# Patient Record
Sex: Female | Born: 1935 | Hispanic: No | Smoking: Never smoker
Health system: Southern US, Community
[De-identification: ages and names within clinical notes are randomized; demographics above are authoritative.]

## PROBLEM LIST (undated history)

## (undated) DIAGNOSIS — I10 Essential (primary) hypertension: Secondary | ICD-10-CM

## (undated) DIAGNOSIS — E119 Type 2 diabetes mellitus without complications: Secondary | ICD-10-CM

## (undated) HISTORY — PX: TOE AMPUTATION: SHX809

## (undated) HISTORY — PX: ABDOMINAL HYSTERECTOMY: SHX81

## (undated) HISTORY — PX: CARDIAC SURGERY: SHX584

---

## 2016-04-17 ENCOUNTER — Encounter: Payer: Self-pay | Admitting: Physical Therapy

## 2016-04-17 ENCOUNTER — Ambulatory Visit: Payer: Medicare Other | Attending: Rheumatology | Admitting: Physical Therapy

## 2016-04-17 DIAGNOSIS — M25611 Stiffness of right shoulder, not elsewhere classified: Secondary | ICD-10-CM | POA: Diagnosis present

## 2016-04-17 DIAGNOSIS — M6281 Muscle weakness (generalized): Secondary | ICD-10-CM

## 2016-04-17 DIAGNOSIS — R296 Repeated falls: Secondary | ICD-10-CM | POA: Insufficient documentation

## 2016-04-17 DIAGNOSIS — M25511 Pain in right shoulder: Secondary | ICD-10-CM

## 2016-04-18 NOTE — Therapy (Deleted)
[facility] [address] [place], [place], [address] Phone: [phone]   Fax:  [phone]  Physical Therapy Evaluation  Patient Details  Name: Hannah Obrien MRN: [id number] Date of Birth: 09/02/1936 Referring Provider: Andrez Grime  Encounter Date: 05-02-16      PT End of Session - 04/18/16 2015/01/23    Visit Number 1   Number of Visits 8   Date for PT Re-Evaluation 05/15/16   Authorization - Visit Number 1   Authorization - Number of Visits 10   PT Start Time 1022   PT Stop Time 1130   PT Time Calculation (min) 68 min   Activity Tolerance Patient tolerated treatment well;Patient limited by pain   Behavior During Therapy [facility] for tasks assessed/performed      History reviewed. No pertinent past medical history.  History reviewed. No pertinent surgical history.  There were no vitals filed for this visit.           [facility] PT Assessment - 04/18/16 0001      Assessment   Medical Diagnosis Chronic Right Shoulder Pain   Referring Provider G.W. Gavin Potters   Onset Date/Surgical Date 09/16/00                                       Plan - 04/18/16 2016/01/23    Rehab Potential Fair   PT Frequency 2x / week   PT Duration 4 weeks   PT Treatment/Interventions Patient/family education;Cryotherapy;Electrical Stimulation;Moist Heat;Neuromuscular re-education;Therapeutic exercise;Therapeutic activities;Functional mobility training;Manual techniques;Passive range of motion   PT Next Visit Plan Review HEP   PT Home Exercise Plan See HEP/ ROM ex. (pulley).        Patient will benefit from skilled therapeutic intervention in order to improve the following deficits and impairments:  Hypomobility, Decreased activity tolerance, Decreased strength, Pain, Decreased mobility, Decreased range of motion, Postural dysfunction, Impaired flexibility, Improper body mechanics  Visit Diagnosis: Pain in right  shoulder  Shoulder joint stiffness, right  Muscle weakness (generalized)      G-Codes - 05/02/2016 01-22-2018    Functional Assessment Tool Used Clinical impression/ QuickDASH/ muscle weakness/ joint stiffness/ pain   Functional Limitation Carrying, moving and handling objects   Carrying, Moving and Handling Objects Current Status [phone]) At least 40 percent but less than 60 percent impaired, limited or restricted   Carrying, Moving and Handling Objects Goal Status (J1791) At least 20 percent but less than 40 percent impaired, limited or restricted       Problem List There are no active problems to display for this patient.   Cammie Mcgee 04/18/2016, 8:21 PM  [place] [facility] [address] [place], [place], [address] Phone: [phone]   Fax:  [phone]  Name: Hannah Obrien MRN: [id number] Date of Birth: 11-Jul-1936

## 2016-04-22 ENCOUNTER — Ambulatory Visit: Payer: Medicare Other | Admitting: Physical Therapy

## 2016-04-22 ENCOUNTER — Encounter: Payer: Self-pay | Admitting: Physical Therapy

## 2016-04-22 DIAGNOSIS — M25611 Stiffness of right shoulder, not elsewhere classified: Secondary | ICD-10-CM

## 2016-04-22 DIAGNOSIS — M6281 Muscle weakness (generalized): Secondary | ICD-10-CM

## 2016-04-22 DIAGNOSIS — M25511 Pain in right shoulder: Secondary | ICD-10-CM | POA: Diagnosis not present

## 2016-04-22 NOTE — Therapy (Signed)
[facility] [address] [place], [place], [address] Phone: [phone]   Fax:  [phone]  Physical Therapy Treatment  Patient Details  Name: Hannah Obrien MRN: [id number] Date of Birth: 02/12/1936 Referring Provider: Andrez Grime  Encounter Date: 04/22/2016      PT End of Session - 04/22/16 1539    Visit Number 2   Number of Visits 8   Date for PT Re-Evaluation 05/15/16   Authorization - Visit Number 2   Authorization - Number of Visits 10   PT Start Time 1449   PT Stop Time 1538   PT Time Calculation (min) 49 min   Activity Tolerance Patient tolerated treatment well;Patient limited by pain   Behavior During Therapy [facility] for tasks assessed/performed      History reviewed. No pertinent past medical history.  History reviewed. No pertinent surgical history.  There were no vitals filed for this visit.      Subjective Assessment - 04/22/16 1534    Subjective Pt. states she is doing better today but R shoulder is still hurting.  No subjective pain score reported today.  No questions with HEP.     Limitations Standing;House hold activities;Other (comment)   Patient Stated Goals Increase R shoulder AROM/ strength to improve pain-free mobility.     Currently in Pain? Yes       OBJECTIVE:  Manual tx.: supine R shoulder AA/PROM all planes of movement 10x each.  STM to R post./ant. Shoulder/ prox, biceps as tolerated.  Gentle grade II-III mobs. To R shoulder (AP/PA/inf. Mobs.) 3x20 sec.  There.ex.: Reviewed HEP.  Supine serratus punches AROM 10x.  Seated R shoulder flexion with wand/ bicep curls while MH is on R shoulder/UT region.  Standing wall ladder 10x (fatigue noted).      Pt response for medical necessity: benefits from progressive R shoulder HEP to increase ROM/ strengthening to improve functional mobility.  (+) R shoulder tenderness noted and moderate crepitus noted during overhead reaching/ flexion.         PT  Education - 04/22/16 1242    Education provided Yes   Education Details Seated shoulder pulley (flexion/ abd.) and pendulum ex.  Encouraged pain tolerable AROM with daily tasks.     Person(s) Educated Patient   Methods Explanation;Demonstration;Handout   Comprehension Verbalized understanding;Returned demonstration             PT Long Term Goals - 04/22/16 1302      PT LONG TERM GOAL #1   Title Pt. I with HEP to increase R shoulder flexion/abd. >130 deg. to improve overhead reaching/ pain-free mobility.     Baseline Decrease B shoulder AROM (R/L):  flexion (124/136 deg.), abd. (85/135 deg.), ER (18/32 deg.), IR (51/85 deg.).    Time 4   Period Weeks   Status New     PT LONG TERM GOAL #2   Title Pt. will decrease QuickDASH to <50% to improve pain-free mobility/ daily tasks.    Baseline QuickDASH: 79.5% on 8/2   Time 4   Period Weeks   Status New     PT LONG TERM GOAL #3   Title Pt. will be able to complete 30 min. of household chores with no increase c/o R shoulder pain.     Baseline pain limited with all daily/ household tasks.  Pt. reports 10/10 R shoulder at worst.    Time 4   Period Weeks   Status New     PT LONG  TERM GOAL #4   Title Pt. able to tolerate sleeping/lying on R shoulder with no increase c/o pain to improve sleeping.     Baseline Unable to lie on R side/shoulder   Time 4   Period Weeks   Status New            Plan - 04/22/16 1542    Clinical Impression Statement Pt. presents with increase R shoulder AROM after manual tx./ stretches.  Pt. remains pain limited with R anterior shoulder tenderness/ UT pain during manual tx.  Significant R shoulder limitations during ER due to pain/ muscle guarding.  Pt. enjoys MH to R UT region during seated R sh. AA/PROM.      Rehab Potential Fair   PT Frequency 2x / week   PT Duration 4 weeks   PT Treatment/Interventions Patient/family education;Cryotherapy;Electrical Stimulation;Moist Heat;Neuromuscular  re-education;Therapeutic exercise;Therapeutic activities;Functional mobility training;Manual techniques;Passive range of motion   PT Next Visit Plan Issue supine/ seated wand ex. AAROM HEP and progress to isometrics.     PT Home Exercise Plan See HEP/ ROM ex. (pulley).     Consulted and Agree with Plan of Care Patient      Patient will benefit from skilled therapeutic intervention in order to improve the following deficits and impairments:  Hypomobility, Decreased activity tolerance, Decreased strength, Pain, Decreased mobility, Decreased range of motion, Postural dysfunction, Impaired flexibility, Improper body mechanics  Visit Diagnosis: Pain in right shoulder  Shoulder joint stiffness, right  Muscle weakness (generalized)     Problem List There are no active problems to display for this patient.  Hannah Obrien, PT, DPT # [id number] 04/22/2016, 3:59 PM  [place] [facility] [address] [place], [place], [address] Phone: [phone]   Fax:  [phone]  Name: Hannah Obrien MRN: [id number] Date of Birth: 19-Sep-1935

## 2016-04-22 NOTE — Addendum Note (Signed)
Addended by: Cammie McgeeSHERK, Loeta Herst C on: 04/22/2016 02:37 PM   Modules accepted: Orders

## 2016-04-22 NOTE — Therapy (Signed)
[facility] [address] [place], [place], [address] Phone: [phone]   Fax:  [phone]  Physical Therapy Evaluation  Patient Details  Name: Ranae Plumbernita M Benbrook MRN: [id number] Date of Birth: 01/01/1936 Referring Provider: Andrez GrimeG.W. [employer]  Encounter Date: 04/17/2016    History reviewed. No pertinent past medical history.  History reviewed. No pertinent surgical history.  There were no vitals filed for this visit.        PT Education - 04/22/16 1242    Education provided Yes   Education Details Seated shoulder pulley (flexion/ abd.) and pendulum ex.  Encouraged pain tolerable AROM with daily tasks.     Person(s) Educated Patient   Methods Explanation;Demonstration;Handout   Comprehension Verbalized understanding;Returned demonstration       R shoulder pain is a 5-6/10 at rest.  10/10 pain with increase activity/ overhead activities.  Pt. unable to lie on R side and using a pillow under R arm with supine positioning.  Pt. reports R shoulder pain began in 2002 after open heart surgery.   Pt. lives alone and assists with taking care of grandkids.      OBJECTIVE:  There.ex.: see HEP.  Manual tx.:  Supine R shoulder AA/PROM 10x all 4-planes (as tolerated)- pt. Remains guarded and pain limited.  Pain limited with PT hand position on prox. R shoulder during R capsular mobility/ mobs. (AP/inf.)- pain limited/ hypomobility.     Pt response for medical necessity:  Pt. benefits from R shoulder ROM/ strengthening ex. Program to improve pain tolerable functional mobility.  R shoulder remains guarded/ limited with all planes of movement.       Pt. is a 80 y/o female with >15 years of R shoulder pain.  Pt. reports B LE neuropathy and pain with prolonged standing and walking tasks.  R hand dominant.  Pt. presents with R UT muscle tightness/ pain and c/o R arm "stays tired all the time".  QuickDASH: 79.5%.  Decrease B shoulder AROM (R/L):  flexion  (124/136 deg.), abd. (85/135 deg.), ER (18/32 deg.), IR (51/85 deg.).  Pain limited with all R shoulder AROM and (+) R anterior deltoid/ B UT tenderness.  L UE muscle strength grossly 4/5 MMT and R UE grossly 3+/5 MMT.  Pt. has difficulty with all overhead tasks/ forward reaching due to R shoulder pain.  Several episodes of "sharp" pain in R anterior shoulder with repetitive/ daily tasks.  Pt. will benefit from skilled PT services to increase R shoulder AROM/ stability to improve pain-free mobility.        PT Long Term Goals - 04/22/16 1302      PT LONG TERM GOAL #1   Title Pt. I with HEP to increase R shoulder flexion/abd. >130 deg. to improve overhead reaching/ pain-free mobility.     Baseline Decrease B shoulder AROM (R/L):  flexion (124/136 deg.), abd. (85/135 deg.), ER (18/32 deg.), IR (51/85 deg.).    Time 4   Period Weeks   Status New     PT LONG TERM GOAL #2   Title Pt. will decrease QuickDASH to <50% to improve pain-free mobility/ daily tasks.    Baseline QuickDASH: 79.5% on 8/2   Time 4   Period Weeks   Status New     PT LONG TERM GOAL #3   Title Pt. will be able to complete 30 min. of household chores with no increase c/o R shoulder pain.     Baseline pain limited with all daily/ household tasks.  Pt. reports 10/10 R shoulder at worst.    Time 4   Period Weeks   Status New     PT LONG TERM GOAL #4   Title Pt. able to tolerate sleeping/lying on R shoulder with no increase c/o pain to improve sleeping.     Baseline Unable to lie on R side/shoulder   Time 4   Period Weeks   Status New      Patient will benefit from skilled therapeutic intervention in order to improve the following deficits and impairments:  Hypomobility, Decreased activity tolerance, Decreased strength, Pain, Decreased mobility, Decreased range of motion, Postural dysfunction, Impaired flexibility, Improper body mechanics  Visit Diagnosis: Pain in right shoulder  Shoulder joint stiffness,  right  Muscle weakness (generalized)     Problem List There are no active problems to display for this patient.  Cammie Mcgee, PT, DPT # [id number]  04/22/2016, 2:29 PM  [place] [facility] [address] [place], [place], [address] Phone: [phone]   Fax:  [phone]  Name: MANASVINI WHATLEY MRN: [id number] Date of Birth: 05/20/1936

## 2016-04-24 ENCOUNTER — Ambulatory Visit: Payer: Medicare Other | Admitting: Physical Therapy

## 2016-04-24 DIAGNOSIS — M25511 Pain in right shoulder: Secondary | ICD-10-CM

## 2016-04-24 DIAGNOSIS — M6281 Muscle weakness (generalized): Secondary | ICD-10-CM

## 2016-04-24 DIAGNOSIS — M25611 Stiffness of right shoulder, not elsewhere classified: Secondary | ICD-10-CM

## 2016-04-24 NOTE — Therapy (Signed)
[facility] [address] [place], [place], [address] Phone: [phone]   Fax:  [phone]  Physical Therapy Treatment  Patient Details  Name: Hannah Obrien MRN: [id number] Date of Birth: 11/07/1935 Referring Provider: Andrez Grime  Encounter Date: 04/24/2016      PT End of Session - 04/24/16 1444    Visit Number 3   Number of Visits 8   Date for PT Re-Evaluation 05/15/16   Authorization - Visit Number 3   Authorization - Number of Visits 10   PT Start Time 1442   PT Stop Time 1524   PT Time Calculation (min) 42 min   Activity Tolerance Patient tolerated treatment well;Patient limited by pain   Behavior During Therapy [facility] for tasks assessed/performed      No past medical history on file.  No past surgical history on file.  There were no vitals filed for this visit.      Subjective Assessment - 04/24/16 1441    Subjective Pt. states she is doing somewhat better and reports a 5/10 R shoulder pain currently.  Pt. reports that she still has episodes of severe R shoulder pain during overhead reaching/ lifting tasks.     Limitations Standing;House hold activities;Other (comment)   Patient Stated Goals Increase R shoulder AROM/ strength to improve pain-free mobility.     Currently in Pain? Yes   Pain Score 5    Pain Location Shoulder   Pain Orientation Right     Supine R shoulder flexion (144 deg.), abd. (108 deg.), ER (22 deg.)- pain limited after manual tx.   OBJECTIVE: There.ex.: B UBE 2 min. F/b (discomfort reported).  Supine serratus punches AROM 10x.  Standing wand ex.: sh. Ext./ ER/ IR (difficulty)- 20x each.  Supine wand ex.: press-ups/ shoulder flexion/ ER (issued for HEP).  Manual tx.: supine R shoulder AA/PROM all planes of movement 10x each.  STM to R post./ant. Shoulder/ prox, biceps as tolerated.  Gentle grade II-III mobs. To R shoulder (AP/PA/inf. Mobs.) 3x20 sec.                          Pt response for  medical necessity: benefits from progressive R shoulder HEP to increase ROM/ strengthening to improve functional mobility.  (+) R shoulder tenderness noted and moderate crepitus noted during overhead reaching/ flexion.  Marked increase in R shoulder AROM (all planes) since initial evaluation.         PT Long Term Goals - 04/22/16 1302      PT LONG TERM GOAL #1   Title Pt. I with HEP to increase R shoulder flexion/abd. >130 deg. to improve overhead reaching/ pain-free mobility.     Baseline Decrease B shoulder AROM (R/L):  flexion (124/136 deg.), abd. (85/135 deg.), ER (18/32 deg.), IR (51/85 deg.).    Time 4   Period Weeks   Status New     PT LONG TERM GOAL #2   Title Pt. will decrease QuickDASH to <50% to improve pain-free mobility/ daily tasks.    Baseline QuickDASH: 79.5% on 8/2   Time 4   Period Weeks   Status New     PT LONG TERM GOAL #3   Title Pt. will be able to complete 30 min. of household chores with no increase c/o R shoulder pain.     Baseline pain limited with all daily/ household tasks.  Pt. reports 10/10 R shoulder at worst.    Time  4   Period Weeks   Status New     PT LONG TERM GOAL #4   Title Pt. able to tolerate sleeping/lying on R shoulder with no increase c/o pain to improve sleeping.     Baseline Unable to lie on R side/shoulder   Time 4   Period Weeks   Status New           Plan - 04/24/16 1445    Clinical Impression Statement Marked increase in R shoulder AROM (after manual tx.) as compared to initial evaluation, esp with flexion/ abd.  Significant c/o R shoulder pain during ER >20 deg.   Significant R shoulder muscle tenderness with light palpation to R UT/ post./ anterior deltoid.  Issue an isometric HEP next tx. session.     Rehab Potential Fair   PT Frequency 2x / week   PT Duration 4 weeks   PT Treatment/Interventions Patient/family education;Cryotherapy;Electrical Stimulation;Moist Heat;Neuromuscular re-education;Therapeutic  exercise;Therapeutic activities;Functional mobility training;Manual techniques;Passive range of motion   PT Next Visit Plan Progress to isometics next tx.  Discuss how wand/AAROM ex. went over weekend.     PT Home Exercise Plan See HEP/ ROM ex. (pulley).     Consulted and Agree with Plan of Care Patient      Patient will benefit from skilled therapeutic intervention in order to improve the following deficits and impairments:  Hypomobility, Decreased activity tolerance, Decreased strength, Pain, Decreased mobility, Decreased range of motion, Postural dysfunction, Impaired flexibility, Improper body mechanics  Visit Diagnosis: Pain in right shoulder  Shoulder joint stiffness, right  Muscle weakness (generalized)     Problem List There are no active problems to display for this patient.  Cammie McgeeMichael C Amos Gaber, PT, DPT # [phone]  04/24/2016, 3:36 PM  [place] [facility] [address] [place], [place], [address] Phone: [phone]   Fax:  [phone]  Name: Hannah Obrien MRN: [id number] Date of Birth: Aug 07, 1936

## 2016-04-29 ENCOUNTER — Ambulatory Visit: Payer: Medicare Other | Admitting: Physical Therapy

## 2016-04-29 DIAGNOSIS — M6281 Muscle weakness (generalized): Secondary | ICD-10-CM

## 2016-04-29 DIAGNOSIS — M25611 Stiffness of right shoulder, not elsewhere classified: Secondary | ICD-10-CM

## 2016-04-29 DIAGNOSIS — M25511 Pain in right shoulder: Secondary | ICD-10-CM

## 2016-04-30 NOTE — Therapy (Addendum)
[place] [facility] [address] [place], [place], [address] Phone: [phone]   Fax:  [phone]  Physical Therapy Treatment  Patient Details  Name: Hannah Obrien MRN: [id number] Date of Birth: 30-May-1936 Referring Provider: Andrez GrimeG[employer]W. [employer]  Encounter Date: 04/29/2016      PT End of Session - 04/29/16 1652    Visit Number 4   Number of Visits 8   Date for PT Re-Evaluation 05/15/16   Authorization - Visit Number 4   Authorization - Number of Visits 10   PT Start Time 1426   PT Stop Time 1516   PT Time Calculation (min) 50 min   Behavior During Therapy [facility] for tasks assessed/performed      No past medical history on file.  No past surgical history on file.  There were no vitals filed for this visit.      Subjective Assessment - 04/29/16 1650    Limitations Standing;House hold activities;Other (comment)   Patient Stated Goals Increase R shoulder AROM/ strength to improve pain-free mobility.     Currently in Pain? Yes   Pain Score 5    Pain Location Shoulder   Pain Orientation Right       Pt. states she is hurting today (5/10 pain reported).  Pt. reports pain may be related to rainy weather.      OBJECTIVE: There.ex.: Nustep B UE/LE 5 min. L4 (R sh. Pain limited at 5 min. Marked)- no charge/ warm-up.  Seated wand AAROM (flexion/ ER/ 90 deg. Abd.)- 10x2 with PT assist. Supine serratus punches AROM 10x. Supine wand ex.: press-ups/ shoulder flexion/ ER (discussed HEP/ no changes at this time).  Manual tx.: supine R shoulder AA/PROM all planes of movement 10x each. STM to R post./ant. Shoulder/ prox, biceps as tolerated. Gentle grade II-III mobs. To R shoulder (AP/PA/inf. Mobs.) 3x20 sec.  MH to R shoulder/neck in seated position after tx. ("feels good")- pt. Doesn't tolerate ice.    Pt response for medical necessity: benefits from progressive R shoulder HEP to increase ROM/ strengthening to improve  functional mobility. (+) R shoulder tenderness noted and moderate crepitus noted during overhead reaching/ flexion.  Pain limited today.  No progression.     Pain limited progression with R shoulder AROM and intro to resistance/strength ex.   PT assist required with seated wand AAROM due to several episodes of "sharp" R shoulder pain.  Marked UE muscle fatigue with 5 min. of Nustep.  No changes to HEP at this time due to increase R shoulder pain prior/t/o tx. session.          PT Long Term Goals - 04/22/16 1302      PT LONG TERM GOAL #1   Title Pt. I with HEP to increase R shoulder flexion/abd. >130 deg. to improve overhead reaching/ pain-free mobility.     Baseline Decrease B shoulder AROM (R/L):  flexion (124/136 deg.), abd. (85/135 deg.), ER (18/32 deg.), IR (51/85 deg.).    Time 4   Period Weeks   Status New     PT LONG TERM GOAL #2   Title Pt. will decrease QuickDASH to <50% to improve pain-free mobility/ daily tasks.    Baseline QuickDASH: 79.5% on 8/2   Time 4   Period Weeks   Status New     PT LONG TERM GOAL #3   Title Pt. will be able to complete 30 min. of household chores with no increase c/o R shoulder pain.  Baseline pain limited with all daily/ household tasks.  Pt. reports 10/10 R shoulder at worst.    Time 4   Period Weeks   Status New     PT LONG TERM GOAL #4   Title Pt. able to tolerate sleeping/lying on R shoulder with no increase c/o pain to improve sleeping.     Baseline Unable to lie on R side/shoulder   Time 4   Period Weeks   Status New               Plan - 04/30/16 1751    Rehab Potential Fair   PT Frequency 2x / week   PT Treatment/Interventions Patient/family education;Cryotherapy;Electrical Stimulation;Moist Heat;Neuromuscular re-education;Therapeutic exercise;Therapeutic activities;Functional mobility training;Manual techniques;Passive range of motion   PT Next Visit Plan Progress to isometics next tx.     PT Home Exercise Plan See  HEP/ ROM ex. (pulley).     Consulted and Agree with Plan of Care Patient      Patient will benefit from skilled therapeutic intervention in order to improve the following deficits and impairments:  Hypomobility, Decreased activity tolerance, Decreased strength, Pain, Decreased mobility, Decreased range of motion, Postural dysfunction, Impaired flexibility, Improper body mechanics  Visit Diagnosis: Pain in right shoulder  Shoulder joint stiffness, right  Muscle weakness (generalized)     Problem List There are no active problems to display for this patient.  Cammie McgeeMichael C Mona Ayars, PT, DPT # [phone]  04/30/2016, 5:52 PM  [place] [facility] [address] [place], [place], [address] Phone: [phone]   Fax:  [phone]  Name: Hannah Obrien MRN: [id number] Date of Birth: 1935-10-24

## 2016-05-01 ENCOUNTER — Ambulatory Visit: Payer: Medicare Other | Admitting: Physical Therapy

## 2016-05-01 DIAGNOSIS — M25511 Pain in right shoulder: Secondary | ICD-10-CM

## 2016-05-01 DIAGNOSIS — M6281 Muscle weakness (generalized): Secondary | ICD-10-CM

## 2016-05-01 DIAGNOSIS — M25611 Stiffness of right shoulder, not elsewhere classified: Secondary | ICD-10-CM

## 2016-05-01 NOTE — Therapy (Addendum)
[place] [facility] [address] [place], [place], [address] Phone: [phone]   Fax:  [phone]  Physical Therapy Treatment  Patient Details  Name: Hannah Obrien Huntsman MRN: [id number] Date of Birth: 1936/06/25 Referring Provider: Andrez GrimeG.W. [employer]  Encounter Date: 05/01/2016      PT End of Session - 05/02/16 1942    Visit Number 5   Number of Visits 8   Date for PT Re-Evaluation 05/15/16   Authorization - Visit Number 5   Authorization - Number of Visits 10   PT Start Time 1451   PT Stop Time 1543   PT Time Calculation (min) 52 min   Activity Tolerance Patient tolerated treatment well;Patient limited by pain   Behavior [facility] for tasks assessed/performed      No past medical history on file.  No past surgical history on file.  There were no vitals filed for this visit.      Subjective Assessment - 05/02/16 1942    Limitations Standing;House hold activities;Other (comment)   Patient Stated Goals Increase R shoulder AROM/ strength to improve pain-free mobility.     Currently in Pain? Yes   Pain Score 5    Pain Location Shoulder   Pain Orientation Right      Pt. states she is doing better today than last tx. session.  Pt. reports R sh. (5/10) pain with overhead reaching/ repetitive tasks.  Pt. states she is compliant with HEP.      OBJECTIVE:  Seated Nautilus: 20# lat. Pull downs/ 20# tricep ext./ 20# single handle scap. Retraction 20x 10x.  Standing bicep curls 10#/ press outs 10# with wand 20x (R shoulder fatigue/ significant R UT muscle and trigger point noted).  Supine R shoulder isometric (min. Resistance) 10x all 4-planes.  Manual tx.: supine R shoulder AA/PROM 10x all of movement (pain tolerable).  R shoulder AP/PA/inf. Grade II-III mobs. 2x20 sec. Each (as tolerated).  MH to R shoulder in sitting position after tx.      Pt response for medical necessity:  Pt. Benefits from R shoulder manual tx./ there.ex. To  improve pain-free ROM/ capsular mobility.  Pain limited with overhead reaching.    QuickDASH: 56.8% (marked improvement since initial evaluation).  Slight improvement in R shoulder AROM as compared to initial evaluation (see goals) but remains pain limited.  Moderate crepitus/ catching in R shoulder with sh. flexion/ overhead/ forward reaching esp. with eccentric muscle control.       PT Long Term Goals - 04/22/16 1302      PT LONG TERM GOAL #1   Title Pt. I with HEP to increase R shoulder flexion/abd. >130 deg. to improve overhead reaching/ pain-free mobility.     Baseline Decrease B shoulder AROM (R/L):  flexion (124/136 deg.), abd. (85/135 deg.), ER (18/32 deg.), IR (51/85 deg.).    Time 4   Period Weeks   Status New     PT LONG TERM GOAL #2   Title Pt. will decrease QuickDASH to <50% to improve pain-free mobility/ daily tasks.    Baseline QuickDASH: 79.5% on 8/2   Time 4   Period Weeks   Status New     PT LONG TERM GOAL #3   Title Pt. will be able to complete 30 min. of household chores with no increase c/o R shoulder pain.     Baseline pain limited with all daily/ household tasks.  Pt. reports 10/10 R shoulder at worst.    Time 4  Period Weeks   Status New     PT LONG TERM GOAL #4   Title Pt. able to tolerate sleeping/lying on R shoulder with no increase c/o pain to improve sleeping.     Baseline Unable to lie on R side/shoulder   Time 4   Period Weeks   Status New               Plan - 05/02/16 1943    Rehab Potential Fair   PT Frequency 2x / week   PT Duration 4 weeks   PT Treatment/Interventions Patient/family education;Cryotherapy;Electrical Stimulation;Moist Heat;Neuromuscular re-education;Therapeutic exercise;Therapeutic activities;Functional mobility training;Manual techniques;Passive range of motion   PT Next Visit Plan Progress to isometics/ strength program next tx.     PT Home Exercise Plan See HEP/ ROM ex. (pulley).        Patient will benefit  from skilled therapeutic intervention in order to improve the following deficits and impairments:  Hypomobility, Decreased activity tolerance, Decreased strength, Pain, Decreased mobility, Decreased range of motion, Postural dysfunction, Impaired flexibility, Improper body mechanics  Visit Diagnosis: Pain in right shoulder  Shoulder joint stiffness, right  Muscle weakness (generalized)     Problem List There are no active problems to display for this patient.  Hannah McgeeMichael C Adrien Obrien, PT, DPT # [id number]  05/02/2016, 7:45 PM  [place] [facility] [address] [place], [place], [address] Phone: [phone]   Fax:  [phone]  Name: Hannah Obrien Hannah Obrien MRN: [id number] Date of Birth: 1936/07/14

## 2016-05-06 ENCOUNTER — Encounter: Payer: Self-pay | Admitting: Physical Therapy

## 2016-05-06 ENCOUNTER — Ambulatory Visit: Payer: Medicare Other | Admitting: Physical Therapy

## 2016-05-06 DIAGNOSIS — M25511 Pain in right shoulder: Secondary | ICD-10-CM | POA: Diagnosis not present

## 2016-05-06 DIAGNOSIS — M25611 Stiffness of right shoulder, not elsewhere classified: Secondary | ICD-10-CM

## 2016-05-06 DIAGNOSIS — M6281 Muscle weakness (generalized): Secondary | ICD-10-CM

## 2016-05-06 DIAGNOSIS — R296 Repeated falls: Secondary | ICD-10-CM

## 2016-05-06 NOTE — Therapy (Signed)
[place] [facility] [address] [place], [place], [address] Phone: [phone]   Fax:  [phone]  Physical Therapy Treatment  Patient Details  Name: Hannah Obrien MRN: [id number] Date of Birth: 04-11-1936 Referring Provider: Sammuel Bailiff  Encounter Date: 05/06/2016      PT End of Session - 05/06/16 1743    Visit Number 6   Number of Visits 8   Date for PT Re-Evaluation 05/15/16   Authorization - Visit Number 6   Authorization - Number of Visits 10   PT Start Time 1751   PT Stop Time 1601   PT Time Calculation (min) 49 min   Activity Tolerance Patient tolerated treatment well;Patient limited by pain   Behavior During Therapy [facility] for tasks assessed/performed      History reviewed. No pertinent past medical history.  History reviewed. No pertinent surgical history.  There were no vitals filed for this visit.      Subjective Assessment - 05/06/16 1738    Subjective Pt reports mild improvement in shoulder pain today, continuing to experience 5/10 pain consistently with reaching tasks. States she is up for shoulder exercise. Pt arrives with new order for "loss of balance"; states that over the past year she has fallen once when sitting back in a chair. States that she suffered no serious injury that she knows of. She does not currently use an assistive device. She is most concerned about feeling like she is stumbling forward during walking and would like to feel steadier on her feet. Denies N/T in bilateral LE; states she has full feeling of her legs and feet.   Limitations Standing;House hold activities;Other (comment)   Patient Stated Goals Increase R shoulder AROM/ strength to improve pain-free mobility. Improve balance so she can feel steadier on her feet    Currently in Pain? Yes   Pain Score 5    Pain Location Shoulder   Pain Orientation Right   Pain Descriptors / Indicators Aching   Pain Type Chronic pain       Objective:  Therapeutic Exercise: UBE forward/backward 2 minutes ea (warm up/no charge). Shoulder AAROM with wand flexion/ER x20; pt experiencing moderate with eccentric control/lowering. Supine press ups 3x10. Supine bicep curls with #1. Standing rows with red theraband 1x10, yellow theraband 1x10 - pt with better tolerance with decreased resistance YTB. Rhythmic stabilization: shoulder 90 deg flexion 1 minute x2.  Neuromuscular re-ed: In // bars: static balance in // bars 1 minute. Tandem stance 2x1 minute no UE support. SLS pt with multiple efforts on bilateral LE; able to maintain balance for <2 sec on BLE. 6'' step taps alternating LE 1 minute.   Pt response for medical necessity: Pt demonstrates improved tolerance to resisted therapeutic exercise this session but continues to experience significant shoulder pain with controlled lowering tasks. She experiences mild onset of pain with rows with RTB but good tolerance of YTB resistance. Pt presents with mild balance deficits primarily associated with SLS stability. She will benefit from skilled PT to progress SLS stability/dynamic balance training to improve safety and prevent falls.       PT Education - 05/06/16 1742    Education provided Yes   Education Details See patient instructions for HEP: issued resisted rows/shoulder ext and tandem balance   Person(s) Educated Patient   Methods Explanation;Demonstration;Handout   Comprehension Verbalized understanding;Returned demonstration             PT Long Term Goals - 05/06/16 1747  PT LONG TERM GOAL #1   Title Pt. I with HEP to increase R shoulder flexion/abd. >130 deg. to improve overhead reaching/ pain-free mobility.     Baseline Decrease B shoulder AROM (R/L):  flexion (130/136 deg.), abd. (92/135 deg.), ER (24/32 deg.), IR (55/85 deg.).    Time 4   Period Weeks   Status Not Met     PT LONG TERM GOAL #2   Title Pt. will decrease QuickDASH to <50% to improve pain-free  mobility/ daily tasks.    Baseline QuickDASH: 56.8% on 8/16   Time 4   Period Weeks   Status Partially Met     PT LONG TERM GOAL #3   Title Pt. will be able to complete 30 min. of household chores with no increase c/o R shoulder pain.     Baseline pain limited with all daily/ household tasks.  Pt. reports 10/10 R shoulder at worst.    Time 4   Period Weeks   Status Not Met     PT LONG TERM GOAL #4   Title Pt. able to tolerate sleeping/lying on R shoulder with no increase c/o pain to improve sleeping.     Baseline Unable to lie on R side/shoulder   Time 4   Period Weeks   Status Not Met     PT LONG TERM GOAL #5   Title Pt will score >55 on BERG balance assessment to promote safety with dynamic activities and longterm mobility   Baseline 8/21: BERG 51/56   Time 4   Period Weeks   Status New     Additional Long Term Goals   Additional Long Term Goals Yes     PT LONG TERM GOAL #6   Title Pt will maintain SLS on bilateral LE for >10 sec to promote single leg stance ability and safety with household tasks.   Baseline 8/21: pt able to maintain SLS for <2 sec on BLE   Time 4   Period Weeks   Status New               Plan - 05/06/16 1744    Clinical Impression Statement Pt continues to demonstrate difficulty with eccentric control of R shoulder with abd/flexion tasks. She shows good tolerance of resisted therapeutic exercise including rows/shoulder ext and shoulder AAROM this session. Pt with new order for "loss of balance": MMT grossly 5/5 bilateral LE. Denies N/T in bilateral LE. BERG: 51/56. Pt demonstrates most difficulty with single leg stance tasks. Will benefit from LE stability program with emphasis on SLS and dynamic balance tasks.   Rehab Potential Fair   PT Frequency 2x / week   PT Duration 4 weeks   PT Treatment/Interventions Patient/family education;Cryotherapy;Electrical Stimulation;Moist Heat;Neuromuscular re-education;Therapeutic exercise;Therapeutic  activities;Functional mobility training;Manual techniques;Passive range of motion   PT Next Visit Plan Progress to isometics/ strength program next tx. Progress balance task difficulty   PT Home Exercise Plan See HEP/ ROM ex. (pulley).     Consulted and Agree with Plan of Care Patient      Patient will benefit from skilled therapeutic intervention in order to improve the following deficits and impairments:  Hypomobility, Decreased activity tolerance, Decreased strength, Pain, Decreased mobility, Decreased range of motion, Postural dysfunction, Impaired flexibility, Improper body mechanics  Visit Diagnosis: Pain in right shoulder  Shoulder joint stiffness, right  Muscle weakness (generalized)  Repeated falls     Problem List There are no active problems to display for this patient.  Pura Spice, PT,  DPT # [phone] Mickel Baas Desiraye Rolfson SPT 05/06/2016, 5:53 PM  [place] [facility] [address] [place], [place], [address] Phone: [phone]   Fax:  [phone]  Name: Hannah Obrien MRN: [id number] Date of Birth: 06/25/1936

## 2016-05-08 ENCOUNTER — Ambulatory Visit: Payer: Medicare Other | Admitting: Physical Therapy

## 2016-05-08 DIAGNOSIS — M25511 Pain in right shoulder: Secondary | ICD-10-CM

## 2016-05-08 DIAGNOSIS — M6281 Muscle weakness (generalized): Secondary | ICD-10-CM

## 2016-05-08 DIAGNOSIS — M25611 Stiffness of right shoulder, not elsewhere classified: Secondary | ICD-10-CM

## 2016-05-08 DIAGNOSIS — R296 Repeated falls: Secondary | ICD-10-CM

## 2016-05-09 NOTE — Therapy (Addendum)
[place] [facility] [address] [place], [place], [address] Phone: [phone]   Fax:  [phone]  Physical Therapy Treatment  Patient Details  Name: Hannah Obrien MRN: [id number] Date of Birth: 10/29/1935 Referring Provider: Sammuel Bailiff  Encounter Date: 05/08/2016      PT End of Session - 05/09/16 1932    Visit Number 7   Number of Visits 8   Date for PT Re-Evaluation 05/15/16   Authorization - Visit Number 7   Authorization - Number of Visits 10   PT Start Time 6195   PT Stop Time 0932   PT Time Calculation (min) 43 min   Activity Tolerance Patient tolerated treatment well;Patient limited by pain   Behavior During Therapy [facility] for tasks assessed/performed      No past medical history on file.  No past surgical history on file.  There were no vitals filed for this visit.      Subjective Assessment - 05/09/16 1916    Subjective Pt. states she is doing okay, no new complaints.    Limitations Standing;House hold activities;Other (comment)   Patient Stated Goals Increase R shoulder AROM/ strength to improve pain-free mobility. Improve balance so she can feel steadier on her feet    Currently in Pain? Yes   Pain Score 4    Pain Orientation Right   Pain Descriptors / Indicators Aching   Pain Type Chronic pain      Objective:  Therapeutic Exercise: UBE forward/backward 2 minutes ea (warm up/no charge). Shoulder AAROM with wand flexion/ER x20; Supine press ups 3x10/ serratus punches 20x (no weight with focus on technique). Supine bicep curls with #1. Standing scap. Retraction with yellow theraband 30x. Reviewed HEP progression/ pain-free sh. ROM focus.   Neuromuscular re-ed: In // bars: static balance in // bars 1 minute. Tandem stance 2x1 minute no UE support. SLS pt with multiple efforts on bilateral LE; able to maintain balance for <2 sec on BLE. 6'' step taps alternating LE 1 minute.  Wt. Shifting/ turning/ cone taps in  //-bars with min. To no UE assist.   Pt response for medical necessity: Pt demonstrates improved tolerance to resisted therapeutic exercise this session but continues to experience significant shoulder pain with controlled lowering tasks.  She will benefit from skilled PT to progress SLS stability/dynamic balance training to improve safety and prevent falls.   Generalized R UT/posterior deltoid tenderness remains present with light palpation.  Moderate cuing for posture correction and preventing shoulder shrugs/ UT compensation with functional reaching/ overhead tasks.  Poor tx. tolerance to any resisted ex..  Generalized deconditioning in B LE with stanidng/ balance tasks.  No LOB during tx. session but pt. challenged with balance/gait tasks in //-bars.          PT Long Term Goals - 05/06/16 1747      PT LONG TERM GOAL #1   Title Pt. I with HEP to increase R shoulder flexion/abd. >130 deg. to improve overhead reaching/ pain-free mobility.     Baseline Decrease B shoulder AROM (R/L):  flexion (130/136 deg.), abd. (92/135 deg.), ER (24/32 deg.), IR (55/85 deg.).    Time 4   Period Weeks   Status Not Met     PT LONG TERM GOAL #2   Title Pt. will decrease QuickDASH to <50% to improve pain-free mobility/ daily tasks.    Baseline QuickDASH: 56.8% on 8/16   Time 4   Period Weeks   Status Partially Met  PT LONG TERM GOAL #3   Title Pt. will be able to complete 30 min. of household chores with no increase c/o R shoulder pain.     Baseline pain limited with all daily/ household tasks.  Pt. reports 10/10 R shoulder at worst.    Time 4   Period Weeks   Status Not Met     PT LONG TERM GOAL #4   Title Pt. able to tolerate sleeping/lying on R shoulder with no increase c/o pain to improve sleeping.     Baseline Unable to lie on R side/shoulder   Time 4   Period Weeks   Status Not Met     PT LONG TERM GOAL #5   Title Pt will score >55 on BERG balance assessment to promote safety with  dynamic activities and longterm mobility   Baseline 8/21: BERG 51/56   Time 4   Period Weeks   Status New     Additional Long Term Goals   Additional Long Term Goals Yes     PT LONG TERM GOAL #6   Title Pt will maintain SLS on bilateral LE for >10 sec to promote single leg stance ability and safety with household tasks.   Baseline 8/21: pt able to maintain SLS for <2 sec on BLE   Time 4   Period Weeks   Status New            Plan - 05/09/16 1934    Rehab Potential Fair   PT Frequency 2x / week   PT Duration 4 weeks   PT Treatment/Interventions Patient/family education;Cryotherapy;Electrical Stimulation;Moist Heat;Neuromuscular re-education;Therapeutic exercise;Therapeutic activities;Functional mobility training;Manual techniques;Passive range of motion   PT Next Visit Plan Progress to isometics/ strength program next tx. Progress balance task difficulty   PT Home Exercise Plan See HEP/ ROM ex. (pulley).        Patient will benefit from skilled therapeutic intervention in order to improve the following deficits and impairments:  Hypomobility, Decreased activity tolerance, Decreased strength, Pain, Decreased mobility, Decreased range of motion, Postural dysfunction, Impaired flexibility, Improper body mechanics  Visit Diagnosis: Pain in right shoulder  Shoulder joint stiffness, right  Muscle weakness (generalized)  Repeated falls     Problem List There are no active problems to display for this patient.  Pura Spice, PT, DPT # [id number]  05/09/2016, 7:36 PM  [place] [facility] [address] [place], [place], [address] Phone: [phone]   Fax:  [phone]  Name: Hannah Obrien MRN: [id number] Date of Birth: [birth year]

## 2016-05-13 ENCOUNTER — Encounter: Payer: Self-pay | Admitting: Physical Therapy

## 2016-05-13 ENCOUNTER — Ambulatory Visit: Payer: Medicare Other | Admitting: Physical Therapy

## 2016-05-13 DIAGNOSIS — M25611 Stiffness of right shoulder, not elsewhere classified: Secondary | ICD-10-CM

## 2016-05-13 DIAGNOSIS — M25511 Pain in right shoulder: Secondary | ICD-10-CM

## 2016-05-13 DIAGNOSIS — R296 Repeated falls: Secondary | ICD-10-CM

## 2016-05-13 DIAGNOSIS — M6281 Muscle weakness (generalized): Secondary | ICD-10-CM

## 2016-05-13 NOTE — Therapy (Addendum)
[place] [facility] [address] [place], [place], [address] Phone: [phone]   Fax:  [phone]  Physical Therapy Treatment  Patient Details  Name: Hannah Obrien MRN: [id number] Date of Birth: [birth year] Referring Provider: Sammuel Bailiff  Encounter Date: 05/13/2016      PT End of Session - 05/13/16 1556    Visit Number 8   Number of Visits 15   Date for PT Re-Evaluation 06/10/16   Authorization - Visit Number 8   Authorization - Number of Visits 17   PT Start Time 1501   PT Stop Time 1601   PT Time Calculation (min) 60 min   Activity Tolerance Patient tolerated treatment well;Patient limited by pain   Behavior During Therapy [facility] for tasks assessed/performed      History reviewed. No pertinent past medical history.  History reviewed. No pertinent surgical history.  There were no vitals filed for this visit.      Subjective Assessment - 05/13/16 1532    Subjective Pt states shoulder is feeling sore today. Says she feels that her balance is not good especially with reaching for objects on high shelves when she is on her tip toes.   Limitations Standing;House hold activities;Other (comment)   Patient Stated Goals Increase R shoulder AROM/ strength to improve pain-free mobility. Improve balance so she can feel steadier on her feet    Currently in Pain? Yes   Pain Score 5    Pain Location Shoulder   Pain Orientation Right   Pain Descriptors / Indicators Aching;Dull   Pain Type Chronic pain      Objective:  Therapeutic Exercise: UBE f/b 2 minutes ea. (warm up/no charge). Supine shoulder flexion AAROM x30, press ups with wand x30. Sidelying shoulder flexion x8; pt with onset of moderate R shoulder pain. Sidelying ER to neutral with #1 x8 with onset of moderate R shoulder pain. Standing scapular retractions with emphasis on thoracic ext x10. Standing rows with YTB 2x10. Standing shoulder ext with YTP 2x10. Standing gastroc stretch  45 sec x2, standing hamstring stretch 45 sec x2.   Neuromuscular Re-ed: In // bars: tandem/backwards tandem walking 50f x6. Partial tandem on air ex pad 30 sec x4. 6'' cone taps with SLS on BLE 4 sets ea. (6 cones per cycle).   Manual tx: STM and ischemic compression of TP to R upper trapezius/R paraspinals secondary to complaints of muscle soreness with moderate trigger points.  Pt response for medical necessity: Pt with fair tolerance of LE strengthening/mobility exercises this session due to increased R shoulder pain. She is able to perform scapular control/stabilization exercises with no c/o of increase shoulder pain. Pt with notable hypertonicity/TP in R upper trapezius. Pt c/o of onset of posterior LE muscle soreness with performance of balance tasks this session.        PT Long Term Goals - 05/06/16 1747      PT LONG TERM GOAL #1   Title Pt. I with HEP to increase R shoulder flexion/abd. >130 deg. to improve overhead reaching/ pain-free mobility.     Baseline Decrease B shoulder AROM (R/L):  flexion (130/136 deg.), abd. (92/135 deg.), ER (24/32 deg.), IR (55/85 deg.).    Time 4   Period Weeks   Status Not Met     PT LONG TERM GOAL #2   Title Pt. will decrease QuickDASH to <50% to improve pain-free mobility/ daily tasks.    Baseline QuickDASH: 56.8% on 8/16   Time 4  Period Weeks   Status Partially Met     PT LONG TERM GOAL #3   Title Pt. will be able to complete 30 min. of household chores with no increase c/o R shoulder pain.     Baseline pain limited with all daily/ household tasks.  Pt. reports 10/10 R shoulder at worst.    Time 4   Period Weeks   Status Not Met     PT LONG TERM GOAL #4   Title Pt. able to tolerate sleeping/lying on R shoulder with no increase c/o pain to improve sleeping.     Baseline Unable to lie on R side/shoulder   Time 4   Period Weeks   Status Not Met     PT LONG TERM GOAL #5   Title Pt will score >55 on BERG balance assessment to promote  safety with dynamic activities and longterm mobility   Baseline 8/21: BERG 51/56   Time 4   Period Weeks   Status New     Additional Long Term Goals   Additional Long Term Goals Yes     PT LONG TERM GOAL #6   Title Pt will maintain SLS on bilateral LE for >10 sec to promote single leg stance ability and safety with household tasks.   Baseline 8/21: pt able to maintain SLS for <2 sec on BLE   Time 4   Period Weeks   Status New            Plan - 05/13/16 1830    Clinical Impression Statement Pt with initial complaints of R shoulder pain when she arrived for PT session. Pt presents with increased shoulder pain primarily during eccentric control. Presents with notable trigger points in R upper trapezius and thoracic paraspinals with significant tenderness to palpation. She experiences moderate increase in shoulder pain with ER of RUE. Pt demonstrates early onset of LE soreness with balance tasks today including tandem walking/partial tandem stance on air ex pad with positive response to hamstring and gastroc stretching program.   Rehab Potential Fair   PT Frequency 2x / week   PT Duration 4 weeks   PT Treatment/Interventions Patient/family education;Cryotherapy;Electrical Stimulation;Moist Heat;Neuromuscular re-education;Therapeutic exercise;Therapeutic activities;Functional mobility training;Manual techniques;Passive range of motion   PT Next Visit Plan Progress to isometics/ strength program next tx. Progress balance task difficulty   PT Home Exercise Plan See HEP/ ROM ex. (pulley).     Consulted and Agree with Plan of Care Patient      Patient will benefit from skilled therapeutic intervention in order to improve the following deficits and impairments:  Hypomobility, Decreased activity tolerance, Decreased strength, Pain, Decreased mobility, Decreased range of motion, Postural dysfunction, Impaired flexibility, Improper body mechanics  Visit Diagnosis: Pain in right  shoulder  Shoulder joint stiffness, right  Muscle weakness (generalized)  Repeated falls       G-Codes - 05-22-16 1520    Functional Assessment Tool Used Clinical impression/ QuickDASH/ muscle weakness/ joint stiffness/ pain   Functional Limitation Carrying, moving and handling objects;Mobility: Walking and moving around   Mobility: Walking and Moving Around Current Status [phone]) At least 20 percent but less than 40 percent impaired, limited or restricted   Mobility: Walking and Moving Around Goal Status [phone]) At least 1 percent but less than 20 percent impaired, limited or restricted   Carrying, Moving and Handling Objects Current Status (X4503) At least 40 percent but less than 60 percent impaired, limited or restricted   Carrying, Moving and Handling Objects Goal Status (  J3648) At least 20 percent but less than 40 percent impaired, limited or restricted      Problem List There are no active problems to display for this patient.  Pura Spice, PT, DPT # [id number] Derrill Memo, SPT 05/14/2016, 3:25 PM  [place] [facility] [address] [place], [place], [address] Phone: [phone]   Fax:  [phone]  Name: Hannah Obrien MRN: [id number] Date of Birth: November 08, 1935

## 2016-05-15 ENCOUNTER — Ambulatory Visit: Payer: Medicare Other | Admitting: Physical Therapy

## 2016-05-15 DIAGNOSIS — M25611 Stiffness of right shoulder, not elsewhere classified: Secondary | ICD-10-CM

## 2016-05-15 DIAGNOSIS — M25511 Pain in right shoulder: Secondary | ICD-10-CM | POA: Diagnosis not present

## 2016-05-15 DIAGNOSIS — R296 Repeated falls: Secondary | ICD-10-CM

## 2016-05-15 DIAGNOSIS — M6281 Muscle weakness (generalized): Secondary | ICD-10-CM

## 2016-05-15 NOTE — Therapy (Signed)
[place] [facility] [address] [place], [place], [address] Phone: [phone]   Fax:  [phone]  Physical Therapy Treatment  Patient Details  Name: Hannah Obrien MRN: [id number] Date of Birth: 1936/03/02 Referring Provider: Sammuel Bailiff  Encounter Date: 05/15/2016      PT End of Session - 05/16/16 1813    Visit Number 9   Number of Visits 15   Date for PT Re-Evaluation 06/10/16   Authorization - Visit Number 9   Authorization - Number of Visits 17   PT Start Time 9563   PT Stop Time 8756   PT Time Calculation (min) 50 min   Activity Tolerance Patient tolerated treatment well;Patient limited by pain   Behavior During Therapy [facility] for tasks assessed/performed      No past medical history on file.  No past surgical history on file.  There were no vitals filed for this visit.      Subjective Assessment - 05/16/16 1811    Subjective Pt. states she is doing alright but shoulders are still hurting.  Pt. states she is trying to do her HEP.     Limitations Standing;House hold activities;Other (comment)   Patient Stated Goals Increase R shoulder AROM/ strength to improve pain-free mobility. Improve balance so she can feel steadier on her feet    Currently in Pain? Yes   Pain Score 5    Pain Location Shoulder   Pain Orientation Right   Pain Descriptors / Indicators Aching;Dull   Pain Type Chronic pain       Objective:  Therapeutic Exercise: Nustep L4-5 10 min. B UE/LE.   Standing shoulder flexion/ abduction/ bicep curls 2# AAROM with wand x20, supine B sh. press ups with wand x30.  Sidelying ER to neutral with #1 x8 with onset of moderate R shoulder pain. Standing scapular retractions with emphasis on thoracic ext x10. Standing rows with YTB 2x10. Standing shoulder ext with YTP 2x10. Standing gastroc stretch 45 sec x2, standing hamstring stretch 45 sec x2.  Reviewed HEP.     Neuromuscular Re-ed: In // bars: tandem  foward/backwards walking 87f x6. Partial tandem on air ex pad 30 sec x4.  Walking marching with alt. UE/LE touches.  NBOS on Airex.  EC/EO activities.     Manual tx: STM and ischemic compression of TP to R upper trapezius/R paraspinals secondary to complaints of muscle soreness with moderate trigger points.  Supine R shoulder AA/PROM all planes as tolerated.    Pt response for medical necessity: Pt benefits from R shoulder strengthening ex./ ROM in pain tolerable range and balance tasks.  Pt. Reports no increase c/o pain after tx. Session.  Moderate B LE muscle fatigue with balance tasks.        PT Long Term Goals - 05/06/16 1747      PT LONG TERM GOAL #1   Title Pt. I with HEP to increase R shoulder flexion/abd. >130 deg. to improve overhead reaching/ pain-free mobility.     Baseline Decrease B shoulder AROM (R/L):  flexion (130/136 deg.), abd. (92/135 deg.), ER (24/32 deg.), IR (55/85 deg.).    Time 4   Period Weeks   Status Not Met     PT LONG TERM GOAL #2   Title Pt. will decrease QuickDASH to <50% to improve pain-free mobility/ daily tasks.    Baseline QuickDASH: 56.8% on 8/16   Time 4   Period Weeks   Status Partially Met     PT LONG  TERM GOAL #3   Title Pt. will be able to complete 30 min. of household chores with no increase c/o R shoulder pain.     Baseline pain limited with all daily/ household tasks.  Pt. reports 10/10 R shoulder at worst.    Time 4   Period Weeks   Status Not Met     PT LONG TERM GOAL #4   Title Pt. able to tolerate sleeping/lying on R shoulder with no increase c/o pain to improve sleeping.     Baseline Unable to lie on R side/shoulder   Time 4   Period Weeks   Status Not Met     PT LONG TERM GOAL #5   Title Pt will score >55 on BERG balance assessment to promote safety with dynamic activities and longterm mobility   Baseline 8/21: BERG 51/56   Time 4   Period Weeks   Status New     Additional Long Term Goals   Additional Long Term Goals  Yes     PT LONG TERM GOAL #6   Title Pt will maintain SLS on bilateral LE for >10 sec to promote single leg stance ability and safety with household tasks.   Baseline 8/21: pt able to maintain SLS for <2 sec on BLE   Time 4   Period Weeks   Status New            Plan - 05/16/16 1814    Clinical Impression Statement Pts. R shoulder AROM remains limited by pain with manual tx./ eccentric muscle control/ AA/PROM in all positions.  Pt. progressing with higher level balance tasks on Airex with lumbar rotn./ changes in BOS.  PT encouraged pt. to continue with HEP and remain compliant with sh. ROM/ strengthening ex. program in a pain tolerable range.      Rehab Potential Fair   PT Frequency 2x / week   PT Duration 4 weeks   PT Treatment/Interventions Patient/family education;Cryotherapy;Electrical Stimulation;Moist Heat;Neuromuscular re-education;Therapeutic exercise;Therapeutic activities;Functional mobility training;Manual techniques;Passive range of motion   PT Next Visit Plan Progress sh. strengthening ex. program and balance tasks   PT Home Exercise Plan See HEP/ ROM ex. (pulley), balance tasks.    Consulted and Agree with Plan of Care Patient      Patient will benefit from skilled therapeutic intervention in order to improve the following deficits and impairments:  Hypomobility, Decreased activity tolerance, Decreased strength, Pain, Decreased mobility, Decreased range of motion, Postural dysfunction, Impaired flexibility, Improper body mechanics  Visit Diagnosis: Pain in right shoulder  Shoulder joint stiffness, right  Muscle weakness (generalized)  Repeated falls     Problem List There are no active problems to display for this patient.  Pura Spice, PT, DPT # [phone]  05/16/2016, 6:21 PM  [facility] [address] [place], [place], [address] Phone: [phone]   Fax:  [phone]  Name: Hannah Obrien MRN:  [id number] Date of Birth: 04-21-1936

## 2016-05-22 ENCOUNTER — Ambulatory Visit: Payer: Medicare Other | Attending: Rheumatology | Admitting: Physical Therapy

## 2016-05-22 ENCOUNTER — Encounter: Payer: Self-pay | Admitting: Physical Therapy

## 2016-05-22 DIAGNOSIS — M25611 Stiffness of right shoulder, not elsewhere classified: Secondary | ICD-10-CM | POA: Insufficient documentation

## 2016-05-22 DIAGNOSIS — M25511 Pain in right shoulder: Secondary | ICD-10-CM | POA: Insufficient documentation

## 2016-05-22 DIAGNOSIS — R296 Repeated falls: Secondary | ICD-10-CM | POA: Insufficient documentation

## 2016-05-22 DIAGNOSIS — M6281 Muscle weakness (generalized): Secondary | ICD-10-CM

## 2016-05-22 NOTE — Therapy (Signed)
[place] [facility] [address]. [place], [place], [address] Phone: [phone]   Fax:  [phone]  Physical Therapy Treatment  Patient Details  Name: Hannah Obrien MRN: [id number] Date of Birth: 02-11-1936 Referring Provider: Sammuel Bailiff  Encounter Date: 05/22/2016      PT End of Session - 05/22/16 1453    Visit Number 10   Number of Visits 15   Date for PT Re-Evaluation 06/10/16   Authorization - Visit Number 10   Authorization - Number of Visits 17   PT Start Time 0910   PT Stop Time 1001   PT Time Calculation (min) 51 min   Activity Tolerance Patient tolerated treatment well;Patient limited by pain   Behavior During Therapy [facility] for tasks assessed/performed      History reviewed. No pertinent past medical history.  History reviewed. No pertinent surgical history.  There were no vitals filed for this visit.      Subjective Assessment - 05/22/16 1452    Subjective Pt states that she has had stiffness in R shoulder; states she used a heating pad last night for pain control.    Limitations Standing;House hold activities;Other (comment)   Patient Stated Goals Increase R shoulder AROM/ strength to improve pain-free mobility. Improve balance so she can feel steadier on her feet    Currently in Pain? Yes   Pain Score 7    Pain Location Shoulder   Pain Orientation Right   Pain Descriptors / Indicators Aching;Dull   Pain Type Chronic pain      Objective:  Therapeutic Exercise: AAROM with wand shoulder flexion 2x10. Chest press 2x10. Bicep curls with #1 BUE 3x10. Hamstring stretch 45 sec x2.  Neuromuscular Re-ed: In // bars with SPT SBA: tandem walking 53f x6 (forwards/backwards) no UE. Tandem walking over 6'' cones for visual hip flexion cueing 156fx4 no UE.   Manual tx: STM R upper trap/levator scap and R anterior deltoid. Pt with moderate trigger point but notices relief most notably with focus on R upper trap.  Pt  response for medical necessity: Pt with early onset of posterior LE fatigue following balance exercise with positive response to hamstring stretch. Pt continues to be limited in R shoulder ROM/strength secondary to moderate-severe pain. She notices improvement in shoulder pain with gentle AAROM but pain does not abate completely.       PT Long Term Goals - 05/06/16 1747      PT LONG TERM GOAL #1   Title Pt. I with HEP to increase R shoulder flexion/abd. >130 deg. to improve overhead reaching/ pain-free mobility.     Baseline Decrease B shoulder AROM (R/L):  flexion (130/136 deg.), abd. (92/135 deg.), ER (24/32 deg.), IR (55/85 deg.).    Time 4   Period Weeks   Status Not Met     PT LONG TERM GOAL #2   Title Pt. will decrease QuickDASH to <50% to improve pain-free mobility/ daily tasks.    Baseline QuickDASH: 56.8% on 8/16   Time 4   Period Weeks   Status Partially Met     PT LONG TERM GOAL #3   Title Pt. will be able to complete 30 min. of household chores with no increase c/o R shoulder pain.     Baseline pain limited with all daily/ household tasks.  Pt. reports 10/10 R shoulder at worst.    Time 4   Period Weeks   Status Not Met     PT LONG  TERM GOAL #4   Title Pt. able to tolerate sleeping/lying on R shoulder with no increase c/o pain to improve sleeping.     Baseline Unable to lie on R side/shoulder   Time 4   Period Weeks   Status Not Met     PT LONG TERM GOAL #5   Title Pt will score >55 on BERG balance assessment to promote safety with dynamic activities and longterm mobility   Baseline 8/21: BERG 51/56   Time 4   Period Weeks   Status New     Additional Long Term Goals   Additional Long Term Goals Yes     PT LONG TERM GOAL #6   Title Pt will maintain SLS on bilateral LE for >10 sec to promote single leg stance ability and safety with household tasks.   Baseline 8/21: pt able to maintain SLS for <2 sec on BLE   Time 4   Period Weeks   Status New             Plan - 05/22/16 1454    Clinical Impression Statement Pt presents with initial 7/10 R sided shoulder pain, decreased to 4/10 following manual tx and AAROM in shoulder flexion with wand. Pt with good tolerance of progressive balance activities; with better performance with repeated practice. Pt with most difficulty with narrow base of support/single leg stance activities at this time.   Rehab Potential Fair   PT Frequency 2x / week   PT Duration 4 weeks   PT Treatment/Interventions Patient/family education;Cryotherapy;Electrical Stimulation;Moist Heat;Neuromuscular re-education;Therapeutic exercise;Therapeutic activities;Functional mobility training;Manual techniques;Passive range of motion   PT Next Visit Plan Progress sh. strengthening ex. program and balance tasks   PT Home Exercise Plan See HEP/ ROM ex. (pulley), balance tasks.    Consulted and Agree with Plan of Care Patient      Patient will benefit from skilled therapeutic intervention in order to improve the following deficits and impairments:  Hypomobility, Decreased activity tolerance, Decreased strength, Pain, Decreased mobility, Decreased range of motion, Postural dysfunction, Impaired flexibility, Improper body mechanics  Visit Diagnosis: Pain in right shoulder  Shoulder joint stiffness, right  Muscle weakness (generalized)  Repeated falls     Problem List There are no active problems to display for this patient.  Pura Spice, PT, DPT # [id number] Mickel Baas Cannon Quinton SPT 05/22/2016, 2:58 PM  [facility] [address] [place], [place], [address] Phone: [phone]   Fax:  [phone]  Name: Hannah Obrien MRN: [id number] Date of Birth: 1936/02/11

## 2016-05-27 ENCOUNTER — Encounter: Payer: Medicare Other | Admitting: Physical Therapy

## 2016-05-29 ENCOUNTER — Encounter: Payer: Self-pay | Admitting: Physical Therapy

## 2016-05-29 ENCOUNTER — Ambulatory Visit: Payer: Medicare Other | Admitting: Physical Therapy

## 2016-05-29 DIAGNOSIS — M25611 Stiffness of right shoulder, not elsewhere classified: Secondary | ICD-10-CM

## 2016-05-29 DIAGNOSIS — M6281 Muscle weakness (generalized): Secondary | ICD-10-CM

## 2016-05-29 DIAGNOSIS — M25511 Pain in right shoulder: Secondary | ICD-10-CM | POA: Diagnosis not present

## 2016-05-29 DIAGNOSIS — R296 Repeated falls: Secondary | ICD-10-CM

## 2016-05-29 NOTE — Therapy (Signed)
[facility] [address] [place], [place], [address] Phone: [phone]   Fax:  [phone]  Physical Therapy Treatment  Patient Details  Name: Hannah Obrien MRN: [id number] Date of Birth: 12-25-1935 Referring Provider: Sammuel Bailiff  Encounter Date: 05/29/2016      PT End of Session - 05/29/16 1533    Visit Number 11   Number of Visits 15   Date for PT Re-Evaluation 06/10/16   Authorization - Visit Number 11   Authorization - Number of Visits 17   PT Start Time 8127   PT Stop Time 5170   PT Time Calculation (min) 46 min   Activity Tolerance Patient tolerated treatment well;Patient limited by pain   Behavior During Therapy [facility] for tasks assessed/performed      History reviewed. No pertinent past medical history.  History reviewed. No pertinent surgical history.  There were no vitals filed for this visit.      Subjective Assessment - 05/29/16 1525    Subjective Pt states that she just returned from vacation to [place]. States she experienced stiffness in shoulder and legs due to cramped plane positioning. She reports that she had some low grade muscle soreness in the back of her legs following last physical therapy session. States that she gets occassional back and leg pain with upright activities.   Limitations Standing;House hold activities;Other (comment)   Patient Stated Goals Increase R shoulder AROM/ strength to improve pain-free mobility. Improve balance so she can feel steadier on her feet    Currently in Pain? Yes   Pain Score 5    Pain Location Shoulder   Pain Orientation Right   Pain Descriptors / Indicators Aching;Dull   Pain Type Chronic pain      Objective:  Neuromuscular Re-ed: Tandem walking forward/backward 3f x4 with no UE support. Static balance on air ex pad with NBOS 1 minute x4. Static balance on air ex pad with partial tandem 1 minute x2. Static balance on air ex pad with bicep curls x30,  hammer curls x30, rows with YTB x30, shoulder flexion with wand to 90 deg x15 before onset of R shoulder pain. Cone taps, 4 cones positioned anteriorly x6 BLE, 2 cones positioned laterally x10 BLE.   Therapeutic Exercise: Standing lumbar ext x10. Standing hamstring stretch 1 minute x2.  Manual tx: Manual stretching bilat LE. Proximal/distal hamstring 1 minute x2 BLE. Piriformis 1 minute x2 BLE. Pt with marked tightness of hamstring and piriformis bilaterally with R>L.  Pt response for medical necessity: Pt arrives to PT session with stiffness/pain of R shoulder. She has initial pain with shoulder AROM but is able to tolerate selected exercises; she experiences no pain with bicep/hammer curls but is aggravated by eccentric control of shoulder flexion. Pt with no LOB during session but with difficulty performing single leg/tandem activities.        PT Education - 05/29/16 1531    Education provided Yes   Education Details see patient instructions: LE stretching, mckenzie lumbar ext   Person(s) Educated Patient   Methods Explanation;Demonstration;Handout   Comprehension Verbalized understanding;Returned demonstration             PT Long Term Goals - 05/06/16 1747      PT LONG TERM GOAL #1   Title Pt. I with HEP to increase R shoulder flexion/abd. >130 deg. to improve overhead reaching/ pain-free mobility.     Baseline Decrease B shoulder AROM (R/L):  flexion (130/136 deg.), abd. (92/135 deg.),  ER (24/32 deg.), IR (55/85 deg.).    Time 4   Period Weeks   Status Not Met     PT LONG TERM GOAL #2   Title Pt. will decrease QuickDASH to <50% to improve pain-free mobility/ daily tasks.    Baseline QuickDASH: 56.8% on 8/16   Time 4   Period Weeks   Status Partially Met     PT LONG TERM GOAL #3   Title Pt. will be able to complete 30 min. of household chores with no increase c/o R shoulder pain.     Baseline pain limited with all daily/ household tasks.  Pt. reports 10/10 R shoulder at  worst.    Time 4   Period Weeks   Status Not Met     PT LONG TERM GOAL #4   Title Pt. able to tolerate sleeping/lying on R shoulder with no increase c/o pain to improve sleeping.     Baseline Unable to lie on R side/shoulder   Time 4   Period Weeks   Status Not Met     PT LONG TERM GOAL #5   Title Pt will score >55 on BERG balance assessment to promote safety with dynamic activities and longterm mobility   Baseline 8/21: BERG 51/56   Time 4   Period Weeks   Status New     Additional Long Term Goals   Additional Long Term Goals Yes     PT LONG TERM GOAL #6   Title Pt will maintain SLS on bilateral LE for >10 sec to promote single leg stance ability and safety with household tasks.   Baseline 8/21: pt able to maintain SLS for <2 sec on BLE   Time 4   Period Weeks   Status New               Plan - 05/29/16 1534    Clinical Impression Statement Pt is limited by shoulder pain/stiffness this session. She demonstrates difficulty with flexion/abduction eccentric control >90 deg. Pt able to tolerate increased LE coordination/balance exercises with positive response to lumbar ext and manual LE stretching with tight hamstring/piriformis bilaterally R>L.    Rehab Potential Fair   PT Frequency 2x / week   PT Duration 4 weeks   PT Treatment/Interventions Patient/family education;Cryotherapy;Electrical Stimulation;Moist Heat;Neuromuscular re-education;Therapeutic exercise;Therapeutic activities;Functional mobility training;Manual techniques;Passive range of motion   PT Next Visit Plan Progress sh. strengthening ex. program and balance tasks   PT Home Exercise Plan See HEP/ ROM ex. (pulley), balance tasks.    Consulted and Agree with Plan of Care Patient      Patient will benefit from skilled therapeutic intervention in order to improve the following deficits and impairments:  Hypomobility, Decreased activity tolerance, Decreased strength, Pain, Decreased mobility, Decreased range  of motion, Postural dysfunction, Impaired flexibility, Improper body mechanics  Visit Diagnosis: Pain in right shoulder  Shoulder joint stiffness, right  Muscle weakness (generalized)  Repeated falls     Problem List There are no active problems to display for this patient.  Pura Spice, PT, DPT # [phone] Mickel Baas Benjaman Artman SPT 05/29/2016, 3:40 PM  [facility] [address] [place], [place], [address] Phone: [phone]   Fax:  [phone]  Name: JAELANI POSA MRN: [id number] Date of Birth: 1936/04/27

## 2016-06-03 ENCOUNTER — Encounter: Payer: Self-pay | Admitting: Physical Therapy

## 2016-06-03 ENCOUNTER — Ambulatory Visit: Payer: Medicare Other | Admitting: Physical Therapy

## 2016-06-03 DIAGNOSIS — M25511 Pain in right shoulder: Secondary | ICD-10-CM

## 2016-06-03 DIAGNOSIS — M25611 Stiffness of right shoulder, not elsewhere classified: Secondary | ICD-10-CM

## 2016-06-03 DIAGNOSIS — M6281 Muscle weakness (generalized): Secondary | ICD-10-CM

## 2016-06-03 DIAGNOSIS — R296 Repeated falls: Secondary | ICD-10-CM

## 2016-06-03 NOTE — Therapy (Signed)
[facility] [address] [place], [place], [address] Phone: [phone]   Fax:  [phone]  Physical Therapy Treatment  Patient Details  Name: Hannah Obrien MRN: [id number] Date of Birth: January 03, 1936 Referring Provider: Sammuel Bailiff  Encounter Date: 06/03/2016      PT End of Session - 06/03/16 1535    Visit Number 12   Number of Visits 15   Date for PT Re-Evaluation 06/10/16   Authorization - Visit Number 12   Authorization - Number of Visits 17   PT Start Time 8099   PT Stop Time 1528   PT Time Calculation (min) 53 min   Activity Tolerance Patient tolerated treatment well;Patient limited by pain;Patient limited by fatigue   Behavior During Therapy [facility] for tasks assessed/performed      History reviewed. No pertinent past medical history.  History reviewed. No pertinent surgical history.  There were no vitals filed for this visit.      Subjective Assessment - 06/03/16 1526    Subjective Pt states she is feeling tired, reports soreness in R shoulder when she uses it. She found out on Friday she has to have breast biopsy next Wednesday. States that she has L posterior leg pain regularly with standing activities.   Limitations Standing;House hold activities;Other (comment)   Patient Stated Goals Increase R shoulder AROM/ strength to improve pain-free mobility. Improve balance so she can feel steadier on her feet    Currently in Pain? Yes   Pain Score 5    Pain Location Shoulder   Pain Orientation Right   Pain Descriptors / Indicators Aching;Dull   Pain Type Chronic pain      Objective:  Therapeutic Exercise: Nu Step Level 3, 5 minutes (no charge). Lumbar rotations in hooklying x10. Standing lumbar lateral flexion x5 R/L. Seated hip abd YTB x30, add pink ball x30.  Neuromuscular Re-ed: In // bars with SPT SBA, no UE support: tandem walking 32f x6, lateral band walks 119fx6, step ups/downs 6'' x5, static balance on BOSU  1 minute trials x4, standing 2 way hip (flexion/ext taps) x20 R/L, abduction x20 R/L.   Pt response for medical necessity: Pt requires several seated rest breaks throughout session secondary to onset of LE pain (primarily LLE; pt believes is peripheral neuropathy pain). Pt with positive response to R lumbar LF, rotation with opening of L side.         PT Education - 06/03/16 1534    Education provided Yes   Education Details see patient instructions; lumbar mobility/stretching   Person(s) Educated Patient   Methods Explanation;Demonstration;Handout   Comprehension Verbalized understanding;Returned demonstration             PT Long Term Goals - 05/06/16 1747      PT LONG TERM GOAL #1   Title Pt. I with HEP to increase R shoulder flexion/abd. >130 deg. to improve overhead reaching/ pain-free mobility.     Baseline Decrease B shoulder AROM (R/L):  flexion (130/136 deg.), abd. (92/135 deg.), ER (24/32 deg.), IR (55/85 deg.).    Time 4   Period Weeks   Status Not Met     PT LONG TERM GOAL #2   Title Pt. will decrease QuickDASH to <50% to improve pain-free mobility/ daily tasks.    Baseline QuickDASH: 56.8% on 8/16   Time 4   Period Weeks   Status Partially Met     PT LONG TERM GOAL #3   Title Pt. will be  able to complete 30 min. of household chores with no increase c/o R shoulder pain.     Baseline pain limited with all daily/ household tasks.  Pt. reports 10/10 R shoulder at worst.    Time 4   Period Weeks   Status Not Met     PT LONG TERM GOAL #4   Title Pt. able to tolerate sleeping/lying on R shoulder with no increase c/o pain to improve sleeping.     Baseline Unable to lie on R side/shoulder   Time 4   Period Weeks   Status Not Met     PT LONG TERM GOAL #5   Title Pt will score >55 on BERG balance assessment to promote safety with dynamic activities and longterm mobility   Baseline 8/21: BERG 51/56   Time 4   Period Weeks   Status New     Additional Long  Term Goals   Additional Long Term Goals Yes     PT LONG TERM GOAL #6   Title Pt will maintain SLS on bilateral LE for >10 sec to promote single leg stance ability and safety with household tasks.   Baseline 8/21: pt able to maintain SLS for <2 sec on BLE   Time 4   Period Weeks   Status New           Plan - 06/03/16 1536    Clinical Impression Statement Pt experiences tightness in bilateral hamstrings with L worse than R (61 deg, 72 deg per inclinometer at patellar in supine SLR). She experiences onset of LE pain which pt states is similar to peripheral neuropathy pain and not representative of muscle fatigue.    Rehab Potential Fair   PT Frequency 2x / week   PT Duration 4 weeks   PT Treatment/Interventions Patient/family education;Cryotherapy;Electrical Stimulation;Moist Heat;Neuromuscular re-education;Therapeutic exercise;Therapeutic activities;Functional mobility training;Manual techniques;Passive range of motion   PT Next Visit Plan Progress sh. strengthening ex. program and balance tasks   PT Home Exercise Plan See HEP/ ROM ex. (pulley), balance tasks, lumbar mobility   Consulted and Agree with Plan of Care Patient      Patient will benefit from skilled therapeutic intervention in order to improve the following deficits and impairments:  Hypomobility, Decreased activity tolerance, Decreased strength, Pain, Decreased mobility, Decreased range of motion, Postural dysfunction, Impaired flexibility, Improper body mechanics  Visit Diagnosis: Pain in right shoulder  Shoulder joint stiffness, right  Muscle weakness (generalized)  Repeated falls     Problem List There are no active problems to display for this patient.  Pura Spice, PT, DPT # [phone] Derrill Memo, SPT 06/04/2016, 11:26 AM  [facility] [address] [place], [place], [address] Phone: [phone]   Fax:  [phone]  Name: Hannah Obrien MRN:  [id number] Date of Birth: May 14, 1936

## 2016-06-05 ENCOUNTER — Ambulatory Visit: Payer: Medicare Other | Admitting: Physical Therapy

## 2016-06-05 DIAGNOSIS — M25611 Stiffness of right shoulder, not elsewhere classified: Secondary | ICD-10-CM

## 2016-06-05 DIAGNOSIS — M25511 Pain in right shoulder: Secondary | ICD-10-CM | POA: Diagnosis not present

## 2016-06-05 DIAGNOSIS — M6281 Muscle weakness (generalized): Secondary | ICD-10-CM

## 2016-06-05 DIAGNOSIS — R296 Repeated falls: Secondary | ICD-10-CM

## 2016-06-06 NOTE — Therapy (Addendum)
[facility] [address] [place], [place], [address] Phone: [phone]   Fax:  [phone]  Physical Therapy Treatment  Patient Details  Name: Hannah Obrien MRN: [id number] Date of Birth: [birth year] Referring Provider: Sammuel Bailiff  Encounter Date: 06/05/2016      PT End of Session - 06/06/16 1955    Visit Number 13   Number of Visits 15   Date for PT Re-Evaluation 06/10/16   Authorization - Visit Number 13   Authorization - Number of Visits 17   PT Start Time 2694   PT Stop Time 1536   PT Time Calculation (min) 48 min   Activity Tolerance Patient tolerated treatment well;Patient limited by pain;Patient limited by fatigue   Behavior During Therapy [facility] for tasks assessed/performed      No past medical history on file.  No past surgical history on file.  There were no vitals filed for this visit.      Subjective Assessment - 06/06/16 1955    Limitations Standing;House hold activities;Other (comment)   Patient Stated Goals Increase R shoulder AROM/ strength to improve pain-free mobility. Improve balance so she can feel steadier on her feet    Currently in Pain? Yes      Pt. states she is planning on focusing on HEP for PT at this time due to scheduled breast biopsy.  Pt. wants to focus on herself with a more independent program and instructed to contact PT if any questions.      Objective:  Therapeutic Exercise: Nu Step Level 3-4, 5 minutes (no charge/warm-up). Lumbar rotations in hooklying x10. Standing lumbar lateral flexion x5 R/L. Seated hip abd YTB x30, add pink ball x30.  Reviewed HEP in depth (see handouts).  Neuromuscular Re-ed: In // bars with SPT SBA, no UE support: tandem walking 61f x6, lateral band walks 157fx6, step ups/downs 6'' x5, static balance on BOSU 1 minute trials x4, standing 2 way hip (flexion/ext taps) x20 R/L, abduction x20 R/L.  Berg reassessment.  Pt response for medical necessity: Pt  requires several seated rest breaks throughout session secondary to onset of LE pain (primarily LLE; pt believes is peripheral neuropathy pain). Pt with positive response to R lumbar LF, rotation with opening of L side.     Berg balance test: 50/56.  QuickDASH: 59%.  L/R shoulder AROM: flexion (152/144 deg.), abd. (111/ 91 deg.).  PT focused on balance tasks/ HEP progression for pt. to be able to self-manage her shoulder/LE strengthening ex. program.  Discharge from PT at this time.          PT Long Term Goals - 05/06/16 1747      PT LONG TERM GOAL #1   Title Pt. I with HEP to increase R shoulder flexion/abd. >130 deg. to improve overhead reaching/ pain-free mobility.     Baseline Decrease B shoulder AROM (R/L):  flexion (130/136 deg.), abd. (92/135 deg.), ER (24/32 deg.), IR (55/85 deg.).    Time 4   Period Weeks   Status Not Met     PT LONG TERM GOAL #2   Title Pt. will decrease QuickDASH to <50% to improve pain-free mobility/ daily tasks.    Baseline QuickDASH: 56.8% on 8/16   Time 4   Period Weeks   Status Partially Met     PT LONG TERM GOAL #3   Title Pt. will be able to complete 30 min. of household chores with no increase c/o R shoulder pain.  Baseline pain limited with all daily/ household tasks.  Pt. reports 10/10 R shoulder at worst.    Time 4   Period Weeks   Status Not Met     PT LONG TERM GOAL #4   Title Pt. able to tolerate sleeping/lying on R shoulder with no increase c/o pain to improve sleeping.     Baseline Unable to lie on R side/shoulder   Time 4   Period Weeks   Status Not Met     PT LONG TERM GOAL #5   Title Pt will score >55 on BERG balance assessment to promote safety with dynamic activities and longterm mobility   Baseline 8/21: BERG 51/56   Time 4   Period Weeks   Status New     Additional Long Term Goals   Additional Long Term Goals Yes     PT LONG TERM GOAL #6   Title Pt will maintain SLS on bilateral LE for >10 sec to promote single  leg stance ability and safety with household tasks.   Baseline 8/21: pt able to maintain SLS for <2 sec on BLE   Time 4   Period Weeks   Status New               Plan - 06/06/16 1956    Rehab Potential Fair   PT Frequency 2x / week   PT Duration 4 weeks   PT Treatment/Interventions Patient/family education;Cryotherapy;Electrical Stimulation;Moist Heat;Neuromuscular re-education;Therapeutic exercise;Therapeutic activities;Functional mobility training;Manual techniques;Passive range of motion   PT Next Visit Plan Discharge at this time   PT Home Exercise Plan See HEP/ ROM ex. (pulley), balance tasks, lumbar mobility   Consulted and Agree with Plan of Care Patient      Patient will benefit from skilled therapeutic intervention in order to improve the following deficits and impairments:  Hypomobility, Decreased activity tolerance, Decreased strength, Pain, Decreased mobility, Decreased range of motion, Postural dysfunction, Impaired flexibility, Improper body mechanics  Visit Diagnosis: Pain in right shoulder  Shoulder joint stiffness, right  Muscle weakness (generalized)  Repeated falls       G-Codes - 2016/06/26 1957    Functional Assessment Tool Used Clinical impression/ QuickDASH/ muscle weakness/ joint stiffness/ pain   Functional Limitation Carrying, moving and handling objects;Mobility: Walking and moving around   Mobility: Walking and Moving Around Current Status [phone]) At least 20 percent but less than 40 percent impaired, limited or restricted   Mobility: Walking and Moving Around Goal Status [phone]) At least 20 percent but less than 40 percent impaired, limited or restricted   Mobility: Walking and Moving Around Discharge Status [phone]) At least 20 percent but less than 40 percent impaired, limited or restricted   Carrying, Moving and Handling Objects Current Status ([id number]) At least 20 percent but less than 40 percent impaired, limited or restricted   Carrying,  Moving and Handling Objects Goal Status (Q0347) At least 20 percent but less than 40 percent impaired, limited or restricted   Carrying, Moving and Handling Objects Discharge Status [phone]) At least 20 percent but less than 40 percent impaired, limited or restricted      Problem List There are no active problems to display for this patient.  Pura Spice, PT, DPT # [phone] 06/06/2016, 7:58 PM  [facility] [address] [place], [place], [address] Phone: [phone]   Fax:  [phone]  Name: Hannah Obrien MRN: [id number] Date of Birth: 09-13-1936

## 2019-03-19 ENCOUNTER — Ambulatory Visit
Admission: EM | Admit: 2019-03-19 | Discharge: 2019-03-19 | Disposition: A | Discharge status: Home / Self Care | Payer: Medicare Other | Attending: Family Medicine | Admitting: Family Medicine

## 2019-03-19 ENCOUNTER — Other Ambulatory Visit: Payer: Self-pay

## 2019-03-19 DIAGNOSIS — G44209 Tension-type headache, unspecified, not intractable: Secondary | ICD-10-CM | POA: Diagnosis not present

## 2019-03-19 DIAGNOSIS — S161XXA Strain of muscle, fascia and tendon at neck level, initial encounter: Secondary | ICD-10-CM | POA: Diagnosis not present

## 2019-03-19 HISTORY — DX: Type 2 diabetes mellitus without complications: E11.9

## 2019-03-19 HISTORY — DX: Essential (primary) hypertension: I10

## 2019-03-19 MED ORDER — METHOCARBAMOL 500 MG PO TABS: 500.0000 mg | ORAL_TABLET | Freq: Two times a day (BID) | ORAL | 0 refills | Status: AC

## 2019-03-19 NOTE — ED Provider Notes (Signed)
[facility]    CSN: [id number] Arrival date & time: 03/19/19  [id number]     History   Chief Complaint Chief Complaint  Patient presents with  . Headache    HPI Hannah Obrien is a 83 y.o. female.   83 yo with c/o headache to the back of her head, pain to the neck and upper back area for the past week.  Denies any falls or other injuries. Denies any rash, fevers, chills, numbness/tingling, one-sided weakness, vision problems. States she woke up one week ago with neck and upper back pain. Pain is worse with movement of the neck. Has not taken any otc analgesic medication.    Headache   Past Medical History:  Diagnosis Date  . Diabetes mellitus without complication (HCC)   . Hypertension     There are no active problems to display for this patient.   Past Surgical History:  Procedure Laterality Date  . ABDOMINAL HYSTERECTOMY    . CARDIAC SURGERY    . TOE AMPUTATION      OB History   No obstetric history on file.      Home Medications    Prior to Admission medications   Medication Sig Start Date End Date Taking? Authorizing Provider  atenolol (TENORMIN) 25 MG tablet  01/21/19  Yes [provider]  Cholecalciferol (VITAMIN D3) 25 MCG (1000 UT) CAPS Take by mouth.   Yes [provider]  cyanocobalamin (,VITAMIN B-12,) 1000 MCG/ML injection INJECT 1ML INTRAMUSCULARLY TWICE MONTHLY 06/16/18  Yes [provider]  ESTRACE VAGINAL 0.1 MG/GM vaginal cream  02/22/19  Yes [provider]  gabapentin (NEURONTIN) 300 MG capsule  03/11/19  Yes [provider]  isosorbide dinitrate (ISORDIL) 30 MG tablet  02/27/19  Yes [provider]  leflunomide (ARAVA) 10 MG tablet  03/11/19  Yes [provider]  metFORMIN (GLUCOPHAGE-XR) 500 MG 24 hr tablet  12/22/18  Yes [provider]  NOVOLOG MIX 70/30 FLEXPEN (70-30) 100 UNIT/ML FlexPen  02/04/19  Yes [provider]  predniSONE (DELTASONE) 5 MG tablet   02/04/19  Yes [provider]  ramipril (ALTACE) 10 MG capsule  02/15/19  Yes [provider]  simvastatin (ZOCOR) 40 MG tablet  02/15/19  Yes [provider]  methocarbamol (ROBAXIN) 500 MG tablet Take 1 tablet (500 mg total) by mouth 2 (two) times daily. 03/19/19   Payton Mccallumonty, Elysabeth Aust, MD    Family History Family History  Problem Relation Age of Onset  . Aneurysm Mother   . Parkinson's disease Father     Social History Social History   Tobacco Use  . Smoking status: Never Smoker  . Smokeless tobacco: Never Used  Substance Use Topics  . Alcohol use: Not Currently  . Drug use: Not Currently     Allergies   Patient has no known allergies.   Review of Systems Review of Systems  Neurological: Positive for headaches.     Physical Exam Triage Vital Signs ED Triage Vitals  Enc Vitals Group     BP 03/19/19 1256 (!) 103/57     Pulse Rate 03/19/19 1256 92     Resp 03/19/19 1256 16     Temp 03/19/19 1256 98.5 F (36.9 C)     Temp Source 03/19/19 1256 Oral     SpO2 03/19/19 1256 95 %     Weight 03/19/19 1251 156 lb (70.8 kg)     Height 03/19/19 1251 5\' 3"  (1.6 m)     Head  Circumference --      Peak Flow --      Pain Score 03/19/19 1251 7     Pain Loc --      Pain Edu? --      Excl. in [place]? --    No data found.  Updated Vital Signs BP (!) 103/57 (BP Location: Left Arm)   Pulse 92   Temp 98.5 F (36.9 C) (Oral)   Resp 16   Ht 5\' 3"  (1.6 m)   Wt 70.8 kg   SpO2 95%   BMI 27.63 kg/m   Visual Acuity Right Eye Distance:   Left Eye Distance:   Bilateral Distance:    Right Eye Near:   Left Eye Near:    Bilateral Near:     Physical Exam Vitals signs and nursing note reviewed.  Constitutional:      General: She is not in acute distress.    Appearance: She is not toxic-appearing or diaphoretic.  Eyes:     Extraocular Movements: Extraocular movements intact.     Pupils: Pupils are equal, round, and reactive to light.  Neck:      Musculoskeletal: No neck rigidity.  Musculoskeletal:     Cervical back: She exhibits tenderness (to palpation over the trapezius muscles bilaterally) and spasm. She exhibits no bony tenderness, no swelling, no edema, no deformity, no laceration and normal pulse.  Neurological:     General: No focal deficit present.     Mental Status: She is alert and oriented to person, place, and time.      UC Treatments / Results  Labs (all labs ordered are listed, but only abnormal results are displayed) Labs Reviewed - No data to display  EKG   Radiology No results found.  Procedures Procedures (including critical care time)  Medications Ordered in UC Medications - No data to display  Initial Impression / Assessment and Plan / UC Course  I have reviewed the triage vital signs and the nursing notes.  Pertinent labs & imaging results that were available during my care of the patient were reviewed by me and considered in my medical decision making (see chart for details).      Final Clinical Impressions(s) / UC Diagnoses   Final diagnoses:  Strain of neck muscle, initial encounter  Tension headache     Discharge Instructions     Heat to area    ED Prescriptions    Medication Sig Dispense Auth. Provider   methocarbamol (ROBAXIN) 500 MG tablet Take 1 tablet (500 mg total) by mouth 2 (two) times daily. 20 tablet Norval Gable, MD      1. diagnosis reviewed with patient 2. rx as per orders above; reviewed possible side effects, interactions, risks and benefits  3. Recommend supportive treatment heat to area, stretches 4. Follow-up prn if symptoms worsen or don't improve   Controlled Substance Prescriptions [place] Controlled Substance Registry consulted? Not Applicable   Norval Gable, MD 03/19/19 1356

## 2019-03-19 NOTE — ED Triage Notes (Signed)
Patient complains of headache x 1 week. Patient states that she has been having pain at her neck and radiates up the back of her head. Patient states that pain has remained constant and has not got any better. Patient states that her mother died of an aneurysm and she is concerned that may be what it is.

## 2019-03-19 NOTE — Discharge Instructions (Signed)
Heat to area

## 2019-04-20 ENCOUNTER — Other Ambulatory Visit: Payer: Self-pay | Admitting: Rheumatology

## 2019-04-20 DIAGNOSIS — M542 Cervicalgia: Secondary | ICD-10-CM

## 2019-05-01 ENCOUNTER — Ambulatory Visit: Payer: Medicare Other

## 2019-05-07 ENCOUNTER — Other Ambulatory Visit: Payer: Self-pay

## 2019-05-07 ENCOUNTER — Ambulatory Visit
Admission: RE | Admit: 2019-05-07 | Discharge: 2019-05-07 | Disposition: A | Discharge status: Home / Self Care | Payer: Medicare Other | Source: Ambulatory Visit | Attending: Rheumatology | Admitting: Rheumatology

## 2019-05-07 DIAGNOSIS — M542 Cervicalgia: Secondary | ICD-10-CM | POA: Insufficient documentation

## 2020-02-10 ENCOUNTER — Other Ambulatory Visit: Payer: Self-pay | Admitting: Rheumatology

## 2020-02-10 DIAGNOSIS — M545 Low back pain, unspecified: Secondary | ICD-10-CM

## 2020-02-10 DIAGNOSIS — M0579 Rheumatoid arthritis with rheumatoid factor of multiple sites without organ or systems involvement: Secondary | ICD-10-CM

## 2020-02-15 ENCOUNTER — Other Ambulatory Visit: Payer: Self-pay

## 2020-02-15 ENCOUNTER — Ambulatory Visit
Admission: RE | Admit: 2020-02-15 | Discharge: 2020-02-15 | Disposition: A | Discharge status: Home / Self Care | Payer: Medicare Other | Source: Ambulatory Visit | Attending: Rheumatology | Admitting: Rheumatology

## 2020-02-15 DIAGNOSIS — M545 Low back pain, unspecified: Secondary | ICD-10-CM

## 2020-02-15 DIAGNOSIS — M0579 Rheumatoid arthritis with rheumatoid factor of multiple sites without organ or systems involvement: Secondary | ICD-10-CM

## 2020-11-10 ENCOUNTER — Encounter: Payer: Self-pay | Admitting: Emergency Medicine

## 2020-11-10 ENCOUNTER — Emergency Department
Admission: EM | Admit: 2020-11-10 | Discharge: 2020-11-10 | Disposition: A | Discharge status: Home / Self Care | Payer: Medicare Other | Attending: Emergency Medicine | Admitting: Emergency Medicine

## 2020-11-10 ENCOUNTER — Emergency Department: Payer: Medicare Other

## 2020-11-10 ENCOUNTER — Other Ambulatory Visit: Payer: Self-pay

## 2020-11-10 DIAGNOSIS — Z794 Long term (current) use of insulin: Secondary | ICD-10-CM

## 2020-11-10 DIAGNOSIS — E119 Type 2 diabetes mellitus without complications: Secondary | ICD-10-CM | POA: Insufficient documentation

## 2020-11-10 DIAGNOSIS — Z79899 Other long term (current) drug therapy: Secondary | ICD-10-CM | POA: Diagnosis not present

## 2020-11-10 DIAGNOSIS — I10 Essential (primary) hypertension: Secondary | ICD-10-CM | POA: Diagnosis not present

## 2020-11-10 DIAGNOSIS — Z7984 Long term (current) use of oral hypoglycemic drugs: Secondary | ICD-10-CM | POA: Diagnosis not present

## 2020-11-10 DIAGNOSIS — R079 Chest pain, unspecified: Secondary | ICD-10-CM | POA: Insufficient documentation

## 2020-11-10 LAB — CBC
HCT: 34.8 % — ABNORMAL LOW (ref 36.0–46.0)
Hemoglobin: 10.8 g/dL — ABNORMAL LOW (ref 12.0–15.0)
MCH: 28.2 pg (ref 26.0–34.0)
MCHC: 31 g/dL (ref 30.0–36.0)
MCV: 90.9 fL (ref 80.0–100.0)
Platelets: 289 10*3/uL (ref 150–400)
RBC: 3.83 MIL/uL — ABNORMAL LOW (ref 3.87–5.11)
RDW: 15.1 % (ref 11.5–15.5)
WBC: 11.2 10*3/uL — ABNORMAL HIGH (ref 4.0–10.5)
nRBC: 0 % (ref 0.0–0.2)

## 2020-11-10 LAB — BASIC METABOLIC PANEL
Anion gap: 9 (ref 5–15)
BUN: 34 mg/dL — ABNORMAL HIGH (ref 8–23)
CO2: 25 mmol/L (ref 22–32)
Calcium: 9.7 mg/dL (ref 8.9–10.3)
Chloride: 102 mmol/L (ref 98–111)
Creatinine, Ser: 1.03 mg/dL — ABNORMAL HIGH (ref 0.44–1.00)
GFR, Estimated: 54 mL/min — ABNORMAL LOW (ref >60)
Glucose, Bld: 170 mg/dL — ABNORMAL HIGH (ref 70–99)
Potassium: 3.8 mmol/L (ref 3.5–5.1)
Sodium: 136 mmol/L (ref 135–145)

## 2020-11-10 LAB — TROPONIN I (HIGH SENSITIVITY)
Troponin I (High Sensitivity): 28 ng/L — ABNORMAL HIGH (ref <18)
Troponin I (High Sensitivity): 31 ng/L — ABNORMAL HIGH (ref <18)

## 2020-11-10 NOTE — ED Triage Notes (Signed)
Pt to ED via [facility] from [facility] in [place], pt states that she went to her regular check up, pt states that she told them that she has been having some intermittent chest discomfort so they wanted her to come get checked out.Pt states that the discomfort is in the center of her chest. The pain does not radiate anywhere. Pt is not currently having the pain. Pt states that when does have it that it feels like indigestion. Pt is in NAD.

## 2020-11-10 NOTE — Discharge Instructions (Signed)
Your EKG and lab test today were okay.  Please continue taking your medications and follow-up with your primary care doctor or cardiologist on Monday.  Return to the hospital if your symptoms worsen.

## 2020-11-10 NOTE — ED Provider Notes (Signed)
[facility] Emergency Department Provider Note  ____________________________________________  Time seen: Approximately 5:35 PM  I have reviewed the triage vital signs and the nursing notes.   HISTORY  Chief Complaint Chest Pain    HPI Hannah Obrien is a 85 y.o. female with a history of hypertension and diabetes who comes ED complaining of central chest pain described as burning.  Nonradiating.  No shortness of breath vomiting or diaphoresis.  Not exertional, not pleuritic.  Feels like indigestion.  No aggravating factors.  Alleviated by taking 1 nitroglycerin at home.  Has been intermittent over the past 4 days, lasting about 30 minutes at a time.      Past Medical History:  Diagnosis Date  . Diabetes mellitus without complication (HCC)   . Hypertension      There are no problems to display for this patient.    Past Surgical History:  Procedure Laterality Date  . ABDOMINAL HYSTERECTOMY    . CARDIAC SURGERY    . TOE AMPUTATION       Prior to Admission medications   Medication Sig Start Date End Date Taking? Authorizing Provider  atenolol (TENORMIN) 25 MG tablet  01/21/19   [provider]  Cholecalciferol (VITAMIN D3) 25 MCG (1000 UT) CAPS Take by mouth.    [provider]  cyanocobalamin (,VITAMIN B-12,) 1000 MCG/ML injection INJECT INTRAMUSCULARLY TWICE MONTHLY 06/16/18   [provider]  ESTRACE VAGINAL 0.1 MG/GM vaginal cream  02/22/19   [provider]  gabapentin (NEURONTIN) 300 MG capsule  03/11/19   [provider]  isosorbide dinitrate (ISORDIL) 30 MG tablet  02/27/19   [provider]  leflunomide (ARAVA) 10 MG tablet  03/11/19   [provider]  metFORMIN (GLUCOPHAGE-XR) 500 MG 24 hr tablet  12/22/18   [provider]  methocarbamol (ROBAXIN) 500 MG tablet Take 1 tablet (500 mg total) by mouth 2 (two) times daily. 03/19/19   Payton Mccallum, MD  NOVOLOG MIX 70/30 FLEXPEN  (70-30) 100 UNIT/ML FlexPen  02/04/19   [provider]  predniSONE (DELTASONE) 5 MG tablet  02/04/19   [provider]  ramipril (ALTACE) 10 MG capsule  02/15/19   [provider]  simvastatin (ZOCOR) 40 MG tablet  02/15/19   [provider]     Allergies Patient has no known allergies.   Family History  Problem Relation Age of Onset  . Aneurysm Mother   . Parkinson's disease Father     Social History Social History   Tobacco Use  . Smoking status: Never Smoker  . Smokeless tobacco: Never Used  Vaping Use  . Vaping Use: Never used  Substance Use Topics  . Alcohol use: Not Currently  . Drug use: Not Currently    Review of Systems  Constitutional:   No fever or chills.  ENT:   No sore throat. No rhinorrhea. Cardiovascular:   Positive chest pain without palpitations or syncope. Respiratory:   No dyspnea or cough. Gastrointestinal:   Negative for abdominal pain, vomiting and diarrhea.  Musculoskeletal:   Negative for focal pain or swelling All other systems reviewed and are negative except as documented above in ROS and HPI.  ____________________________________________   PHYSICAL EXAM:  VITAL SIGNS: ED Triage Vitals  Enc Vitals Group     BP 11/10/20 1424 (!) 147/66     Pulse Rate 11/10/20 1424 93     Resp 11/10/20 1424 18     Temp 11/10/20 1424 98.1 F (36.7  C)     Temp Source 11/10/20 1424 Oral     SpO2 11/10/20 1424 97 %     Weight 11/10/20 1430 158 lb (71.7 kg)     Height 11/10/20 1430 5\' 3"  (1.6 m)     Head Circumference --      Peak Flow --      Pain Score 11/10/20 1430 3     Pain Loc --      Pain Edu? --      Excl. in GC? --     Vital signs reviewed, nursing assessments reviewed.   Constitutional:   Alert and oriented. Non-toxic appearance. Eyes:   Conjunctivae are normal. EOMI. PERRL. ENT      Head:   Normocephalic and atraumatic.      Nose:   Wearing a mask.      Mouth/Throat:   Wearing a mask.      Neck:    No meningismus. Full ROM. Hematological/Lymphatic/Immunilogical:   No cervical lymphadenopathy. Cardiovascular:   RRR. Symmetric bilateral radial and DP pulses.  No murmurs. Cap refill less than 2 seconds. Respiratory:   Normal respiratory effort without tachypnea/retractions. Breath sounds are clear and equal bilaterally. No wheezes/rales/rhonchi. Gastrointestinal:   Soft and nontender. Non distended. .  No rebound, rigidity, or guarding.  Musculoskeletal:   Normal range of motion in all extremities. No joint effusions.  No lower extremity tenderness.  No edema. Neurologic:   Normal speech and language.  Motor grossly intact. No acute focal neurologic deficits are appreciated.  Skin:    Skin is warm, dry and intact. No rash noted.  No petechiae, purpura, or bullae.  ____________________________________________    LABS (pertinent positives/negatives) (all labs ordered are listed, but only abnormal results are displayed) Labs Reviewed  BASIC METABOLIC PANEL - Abnormal; Notable for the following components:      Result Value   Glucose, Bld 170 (*)    BUN 34 (*)    Creatinine, Ser 1.03 (*)    GFR, Estimated 54 (*)    All other components within normal limits  CBC - Abnormal; Notable for the following components:   WBC 11.2 (*)    RBC 3.83 (*)    Hemoglobin 10.8 (*)    HCT 34.8 (*)    All other components within normal limits  TROPONIN I (HIGH SENSITIVITY) - Abnormal; Notable for the following components:   Troponin I (High Sensitivity) 28 (*)    All other components within normal limits  TROPONIN I (HIGH SENSITIVITY) - Abnormal; Notable for the following components:   Troponin I (High Sensitivity) 31 (*)    All other components within normal limits   ____________________________________________   EKG  Interpreted by me Normal sinus rhythm rate of 92, normal axis and intervals.  LVH in the high lateral leads.  Normal ST segments and T waves, no ischemic  changes.  ____________________________________________    RADIOLOGY  DG Chest 2 View  Result Date: 11/10/2020 CLINICAL DATA:  Chest pain, intermittent chest discomfort EXAM: CHEST - 2 VIEW COMPARISON:  None Correlation: MRI thoracic spine 02/15/2020 FINDINGS: Normal heart size post median sternotomy. Mediastinal contours and pulmonary vascularity normal. Vague nodular density projects over RIGHT upper lobe cannot exclude pulmonary nodule 19 x 11 mm in size. Remaining lungs clear. No infiltrate, pleural effusion, or pneumothorax. Bones demineralized with degenerative disc disease changes thoracic spine and chronic compression fracture of T11 vertebra, unchanged from prior MR. IMPRESSION: Chronic T11 compression fracture. No acute infiltrate. Question 19 x  11 mm diameter RIGHT upper lobe nodule; CT chest recommended for further assessment. Electronically Signed   By: Ulyses Southward M.D.   On: 11/10/2020 15:13    ____________________________________________   PROCEDURES Procedures  ____________________________________________  DIFFERENTIAL DIAGNOSIS   Non-STEMI, GERD, pneumonia, pulmonary edema, pleural effusion  CLINICAL IMPRESSION / ASSESSMENT AND PLAN / ED COURSE  Medications ordered in the ED: Medications - No data to display  Pertinent labs & imaging results that were available during my care of the patient were reviewed by me and considered in my medical decision making (see chart for details).  Hannah Obrien was evaluated in Emergency Department on 11/10/2020 for the symptoms described in the history of present illness. She was evaluated in the context of the global COVID-19 pandemic, which necessitated consideration that the patient might be at risk for infection with the SARS-CoV-2 virus that causes COVID-19. Institutional protocols and algorithms that pertain to the evaluation of patients at risk for COVID-19 are in a state of rapid change based on information released by regulatory  bodies including the CDC and federal and state organizations. These policies and algorithms were followed during the patient's care in the ED.   Patient presents with nonspecific intermittent chest discomfort.  Patient feels that it is GERD.  She is nontoxic, pain is currently resolved.  EKG is unremarkable vital signs are normal.  Exam is otherwise reassuring. Considering the patient's symptoms, medical history, and physical examination today, I have low suspicion for ACS, PE, TAD, pneumothorax, carditis, mediastinitis, pneumonia, CHF, or sepsis.  Due to her age and comorbidities, recommended hospitalization for telemetry monitoring and further cardiac work-up.  Patient declines because she needs to go home to take care of her grandchild while her daughter goes to work.  She feels comfortable with outpatient follow-up.  Serial troponins are flat, HPI is not high risk.  She agrees to return if symptoms worsen.      ____________________________________________   FINAL CLINICAL IMPRESSION(S) / ED DIAGNOSES    Final diagnoses:  Nonspecific chest pain  Type 2 diabetes mellitus without complication, with long-term current use of insulin [facility])     ED Discharge Orders    None      Portions of this note were generated with dragon dictation software. Dictation errors may occur despite best attempts at proofreading.   Sharman Cheek, MD 11/10/20 1740

## 2020-11-10 NOTE — ED Triage Notes (Signed)
Pt comes into the ED via EMS from [facility] in [place] with c/o intermittent chest discomfort that feels lilke indigestion  #22G LFA 147/79 100HR 20RR 94%RA

## 2022-11-03 IMAGING — CR DG CHEST 2V
1 series · 2 of 2 positions shown · non-contrast
Comparison: None

Correlation: MRI thoracic spine 02/15/2020

CLINICAL DATA: Chest pain, intermittent chest discomfort

EXAM:
CHEST - 2 VIEW

[Series 1: dg chest 2 view · 0.14mm/px · 2 of 2 slices shown]
[im 1/2]
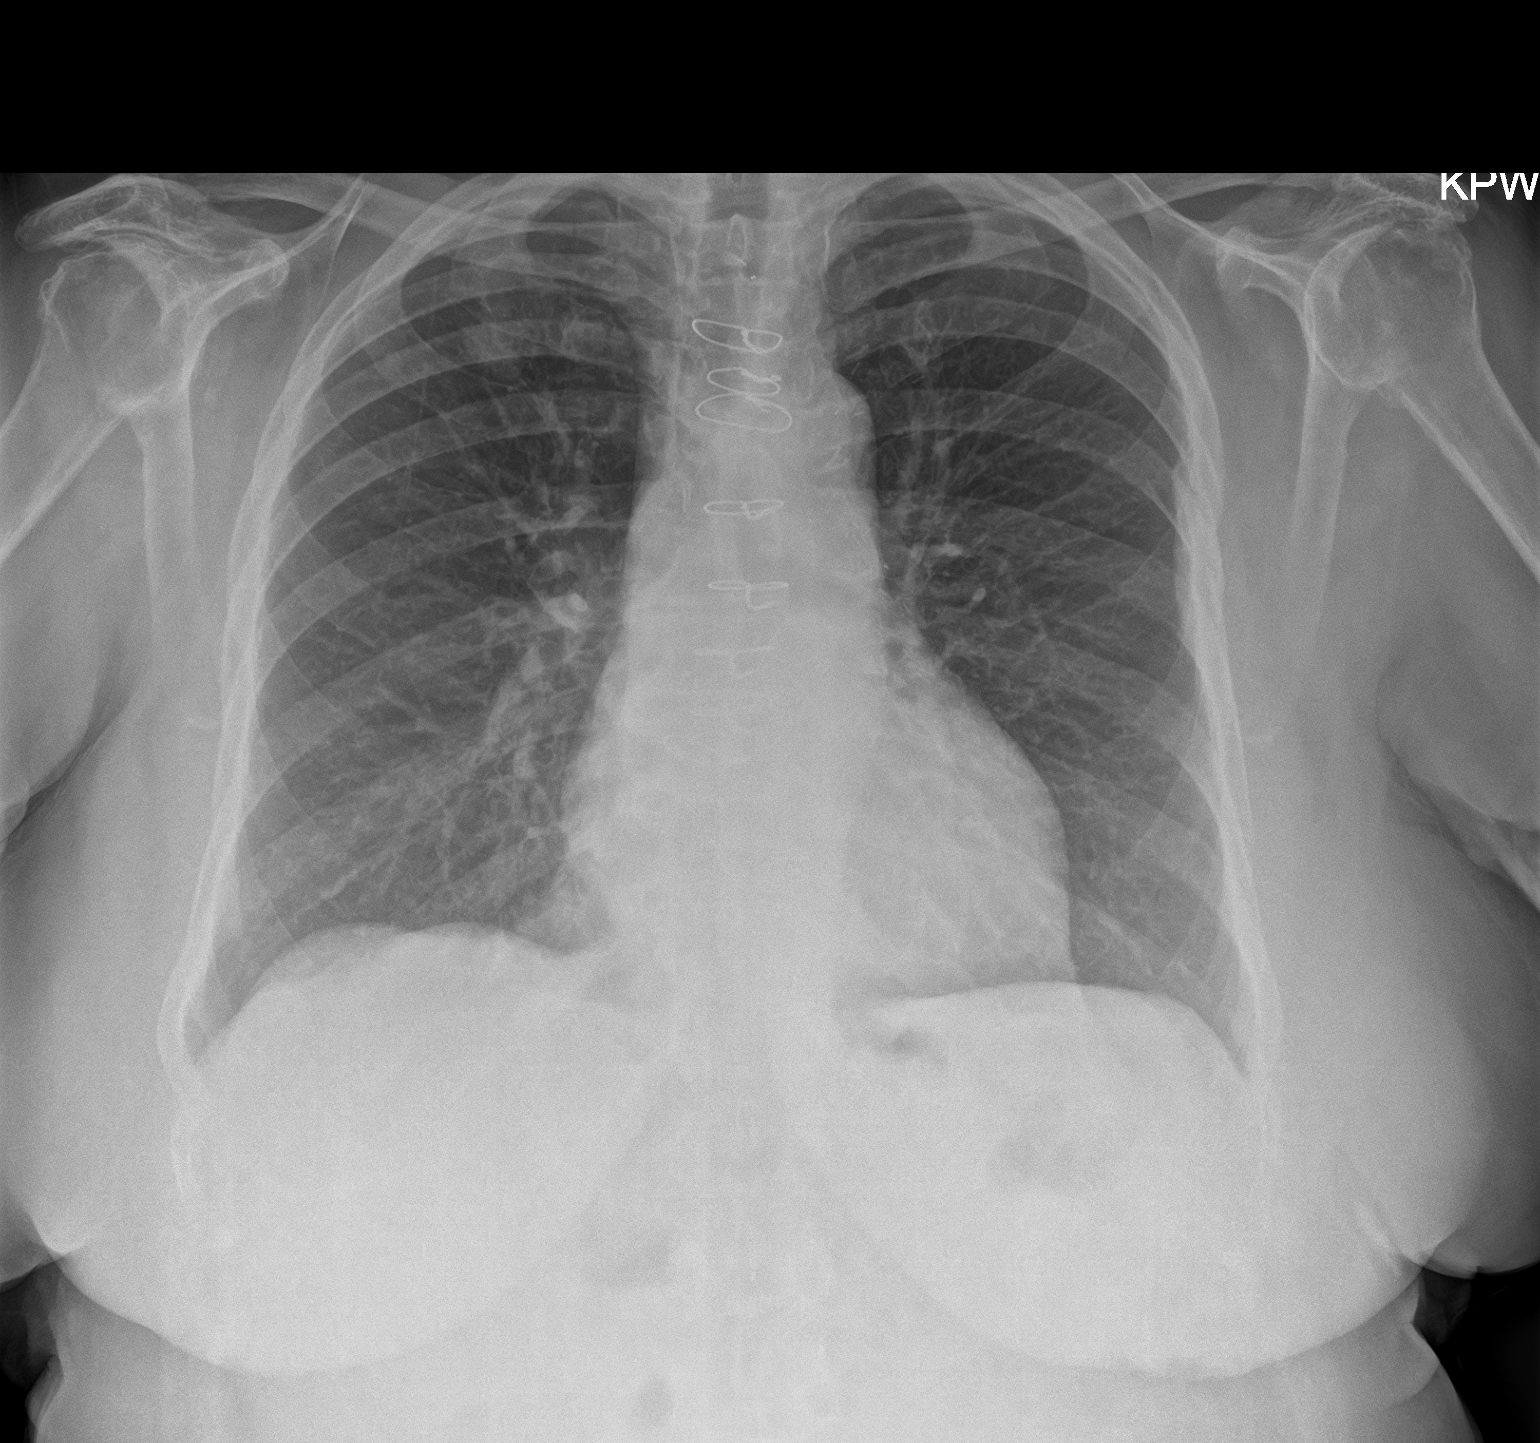
[im 2/2]
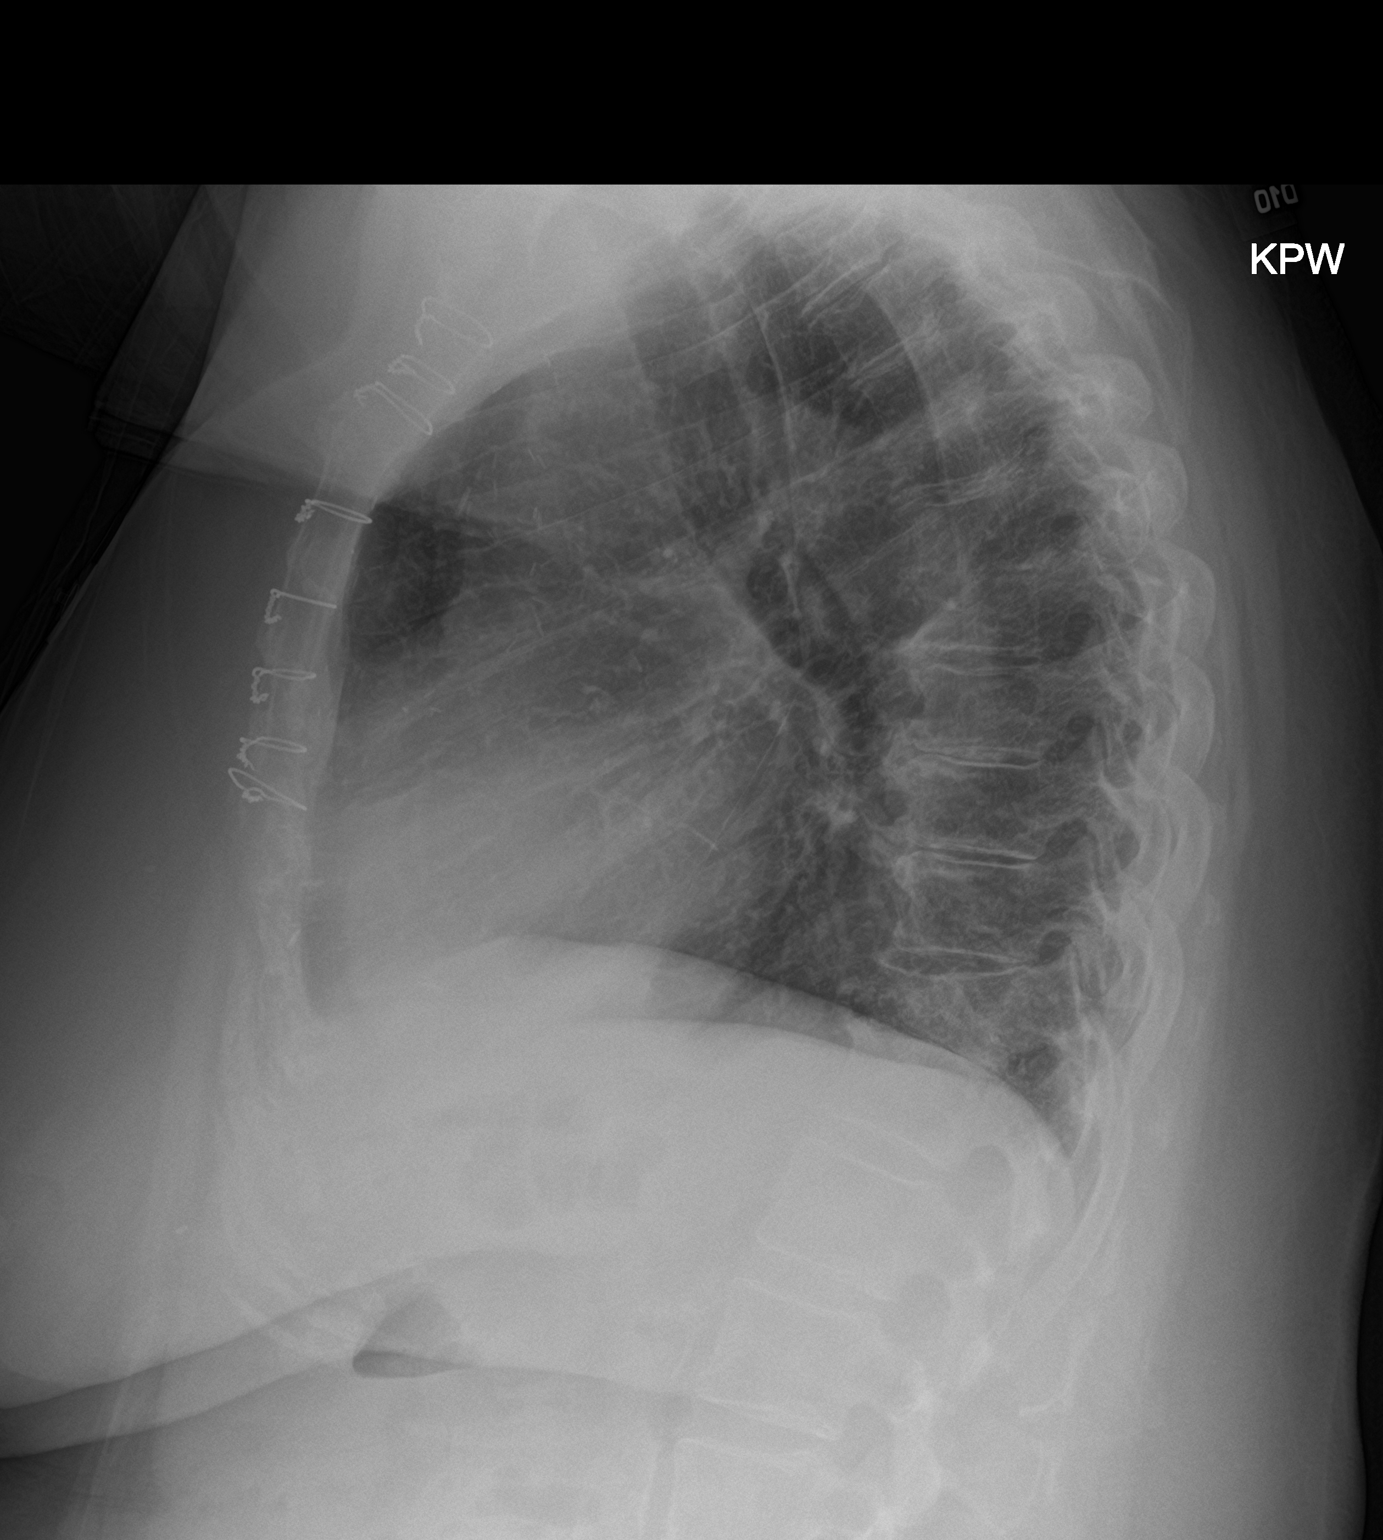

[2 of 2 positions shown; findings below may reference images not displayed]

FINDINGS: Normal heart size post median sternotomy.

Mediastinal contours and pulmonary vascularity normal.

Vague nodular density projects over RIGHT upper lobe cannot exclude
pulmonary nodule 19 x 11 mm in size.

Remaining lungs clear.

No infiltrate, pleural effusion, or pneumothorax.

Bones demineralized with degenerative disc disease changes thoracic
spine and chronic compression fracture of T11 vertebra, unchanged
from prior MR.
IMPRESSION: Chronic T11 compression fracture.

No acute infiltrate.

Question 19 x 11 mm diameter RIGHT upper lobe nodule; CT chest
recommended for further assessment.
